# Patient Record
Sex: Male | Born: 1960
Health system: Southern US, Community
[De-identification: ages and names within clinical notes are randomized; demographics above are authoritative.]

## PROBLEM LIST (undated history)

## (undated) DIAGNOSIS — K219 Gastro-esophageal reflux disease without esophagitis: Secondary | ICD-10-CM

## (undated) DIAGNOSIS — E785 Hyperlipidemia, unspecified: Secondary | ICD-10-CM

## (undated) HISTORY — PX: CHOLECYSTECTOMY: SHX55

## (undated) HISTORY — PX: HERNIA REPAIR: SHX51

## (undated) HISTORY — PX: APPENDECTOMY: SHX54

## (undated) HISTORY — PX: OTHER SURGICAL HISTORY: SHX169

## (undated) HISTORY — DX: Hyperlipidemia, unspecified: E78.5

## (undated) HISTORY — DX: Gastro-esophageal reflux disease without esophagitis: K21.9

---

## 2004-12-16 ENCOUNTER — Ambulatory Visit (HOSPITAL_COMMUNITY): Admission: RE | Admit: 2004-12-16 | Discharge: 2004-12-16 | Payer: Self-pay | Admitting: *Deleted

## 2005-01-05 ENCOUNTER — Ambulatory Visit (HOSPITAL_COMMUNITY): Admission: RE | Admit: 2005-01-05 | Discharge: 2005-01-05 | Payer: Self-pay | Admitting: General Surgery

## 2005-01-09 ENCOUNTER — Encounter (HOSPITAL_COMMUNITY): Admission: RE | Admit: 2005-01-09 | Discharge: 2005-02-08 | Payer: Self-pay | Admitting: Internal Medicine

## 2005-01-16 ENCOUNTER — Ambulatory Visit (HOSPITAL_COMMUNITY): Admission: RE | Admit: 2005-01-16 | Discharge: 2005-01-16 | Payer: Self-pay | Admitting: General Surgery

## 2011-04-13 ENCOUNTER — Other Ambulatory Visit: Payer: Self-pay | Admitting: General Surgery

## 2011-04-13 ENCOUNTER — Encounter (HOSPITAL_COMMUNITY): Payer: Managed Care, Other (non HMO)

## 2011-04-13 LAB — CBC
Hemoglobin: 15 g/dL (ref 13.0–17.0)
RBC: 5.1 MIL/uL (ref 4.22–5.81)

## 2011-04-13 LAB — SURGICAL PCR SCREEN: MRSA, PCR: NEGATIVE

## 2011-04-13 LAB — BASIC METABOLIC PANEL
CO2: 26 mEq/L (ref 19–32)
Chloride: 103 mEq/L (ref 96–112)
GFR calc Af Amer: >60 mL/min (ref >60)
Sodium: 138 mEq/L (ref 135–145)

## 2011-04-13 NOTE — H&P (Signed)
  NAME:  Steve Stone, Steve Stone NO.:  [id number]  MEDICAL RECORD NO.:  [id number]  LOCATION:                                 FACILITY:  PHYSICIAN:  Dalia Heading, M.D.  DATE OF BIRTH:  June 06, 1961  DATE OF ADMISSION: DATE OF DISCHARGE:  LH                             HISTORY & PHYSICAL   CHIEF COMPLAINT:  Left inguinal hernia.  HISTORY OF PRESENT ILLNESS:  The patient is a 50 year old white male who presents with a left inguinal hernia.  It has been present over the past few months, it has increased in size, and is causing him discomfort.  No nausea or vomiting have been noted.  PAST MEDICAL HISTORY:  Reflux disease.  PAST SURGICAL HISTORY:  Appendectomy and cholecystectomy.  CURRENT MEDICATIONS:  Nexium daily.  ALLERGIES:  No known drug allergies.  SOCIAL HISTORY:  The patient smokes a pack of cigarettes a day.  Drinks alcohol socially.  Denies any illicit drug use.  REVIEW OF SYSTEMS:  He denies any other cardiopulmonary difficulties or bleeding disorders.  FAMILY MEDICAL HISTORY:  Noncontributory.  PHYSICAL EXAMINATION:  GENERAL:  The patient is a well-developed, well- nourished white male in no acute distress.  He is afebrile.  VITAL SIGNS:  Stable. HEENT:  Unremarkable. NECK:  Supple without lymphadenopathy or carotid bruits. LUNGS:  Clear to auscultation with equal breath sounds bilaterally. HEART:  Regular rate and rhythm without S3, S4, or murmurs. ABDOMEN:  Soft and nontender, nondistended.  A reducible left inguinal hernia is present.  No rigidity is noted.  GENITOURINARY:  Within normal limits.  IMPRESSION:  Left inguinal hernia.  PLAN:  The patient is scheduled for left inguinal herniorrhaphy on April 17, 2011.  The risks and benefits of the procedure including bleeding, infection, recurrence of the hernia were fully explained to the patient, gave informed consent.     Dalia Heading, M.D.     MAJ/MEDQ  D:  04/11/2011  T:   04/12/2011  Job:  [id number]  cc:   Dr. [facility]  Short Stay [facility]  Electronically Signed by Franky Macho M.D. on 04/13/2011 04:54:09 PM

## 2011-04-17 ENCOUNTER — Ambulatory Visit (HOSPITAL_COMMUNITY)
Admission: RE | Admit: 2011-04-17 | Discharge: 2011-04-17 | Disposition: A | Discharge status: Home / Self Care | Payer: Managed Care, Other (non HMO) | Source: Ambulatory Visit | Attending: General Surgery | Admitting: General Surgery

## 2011-04-17 DIAGNOSIS — Z01818 Encounter for other preprocedural examination: Secondary | ICD-10-CM | POA: Insufficient documentation

## 2011-04-17 DIAGNOSIS — Z01812 Encounter for preprocedural laboratory examination: Secondary | ICD-10-CM | POA: Insufficient documentation

## 2011-04-17 DIAGNOSIS — K4090 Unilateral inguinal hernia, without obstruction or gangrene, not specified as recurrent: Secondary | ICD-10-CM | POA: Insufficient documentation

## 2011-04-18 NOTE — Op Note (Signed)
  NAME:  Steve Stone, KNOBLOCK NO.:  [id number]  MEDICAL RECORD NO.:  [id number]  LOCATION:  [facility]                          FACILITY:  [facility]  PHYSICIAN:  Dalia Heading, M.D.  DATE OF BIRTH:  1961/08/19  DATE OF PROCEDURE:  04/17/2011 DATE OF DISCHARGE:                              OPERATIVE REPORT   PREOPERATIVE DIAGNOSIS:  Left inguinal hernia.  POSTOPERATIVE DIAGNOSIS:  Left inguinal hernia, indirect.  PROCEDURE:  Left inguinal herniorrhaphy.  SURGEON:  Dalia Heading, MD  ANESTHESIA:  General endotracheal.  INDICATIONS:  The patient is a 51 year old white male who presents with a symptomatic left inguinal hernia.  The risks and benefits of the procedure including bleeding, infection, pain, and recurrence of the hernia were fully explained to the patient, gave informed consent.  PROCEDURE NOTE:  The patient was placed in supine position.  After induction of general endotracheal anesthesia, the left groin region was prepped and draped using the usual sterile technique with Betadine. Surgical site confirmation was performed.  A transverse incision was made in the left groin region down to the external oblique aponeurosis.  The aponeurosis was incised to the external ring.  A Penrose drain was placed around the spermatic cord. The vas deferens was noted within the spermatic cord.  The ilioinguinal nerve was identified and retracted superiorly from the operative field. An indirect hernia sac was found along with a lipoma of the cord.  The lipoma was excised.  The indirect hernia sac was dissected up to the perineal reflection and inverted.  A medium-size Bard mesh PerFix plug was then inserted and secured to the transversalis fascia using 2-0 Novofil interrupted sutures.  An onlay patch was then placed along the floor of the inguinal canal and secured superiorly to the conjoined tendon and inferiorly to the shelving edge of Poupart ligament using 2-0 Novofil  interrupted sutures.  The internal ring was recreated using a 2- 0 Novofil interrupted suture.  The external oblique aponeurosis was reapproximated using a 2-0 Vicryl running suture.  Subcutaneous layer was reapproximated using 3-0 Vicryl interrupted sutures.  The skin was closed using a 4-0 Vicryl subcuticular suture.  A 0.5% Sensorcaine was instilled into the surrounding wound.  Dermabond was then applied.  All tape and needle counts were correct at the end of the procedure. The patient was extubated in the operating room and went back to recovery room awake in stable condition.  COMPLICATIONS:  None.  SPECIMEN:  None.  BLOOD LOSS:  Minimal.     Dalia Heading, M.D.     MAJ/MEDQ  D:  04/17/2011  T:  04/18/2011  Job:  [id number]  cc:   Dr[facility], [place], [facility]  Electronically Signed by Franky Macho M.D. on 04/18/2011 12:28:11 PM

## 2012-08-07 ENCOUNTER — Other Ambulatory Visit: Payer: Self-pay | Admitting: Cardiology

## 2012-08-08 ENCOUNTER — Encounter: Payer: Self-pay | Admitting: Cardiology

## 2012-08-08 DIAGNOSIS — R079 Chest pain, unspecified: Secondary | ICD-10-CM

## 2012-08-12 DIAGNOSIS — R079 Chest pain, unspecified: Secondary | ICD-10-CM

## 2012-09-11 ENCOUNTER — Encounter: Payer: Self-pay | Admitting: Cardiology

## 2012-09-26 ENCOUNTER — Encounter: Payer: Self-pay | Admitting: Cardiology

## 2012-09-26 ENCOUNTER — Ambulatory Visit (INDEPENDENT_AMBULATORY_CARE_PROVIDER_SITE_OTHER): Payer: BC Managed Care – PPO | Admitting: Cardiology

## 2012-09-26 VITALS — BP 114/76 | HR 80 | Ht 73.0 in | Wt 232.8 lb

## 2012-09-26 DIAGNOSIS — R0989 Other specified symptoms and signs involving the circulatory and respiratory systems: Secondary | ICD-10-CM

## 2012-09-26 DIAGNOSIS — R0609 Other forms of dyspnea: Secondary | ICD-10-CM

## 2012-09-26 DIAGNOSIS — R079 Chest pain, unspecified: Secondary | ICD-10-CM | POA: Insufficient documentation

## 2012-09-26 DIAGNOSIS — Z136 Encounter for screening for cardiovascular disorders: Secondary | ICD-10-CM

## 2012-09-26 DIAGNOSIS — R0600 Dyspnea, unspecified: Secondary | ICD-10-CM | POA: Insufficient documentation

## 2012-09-26 NOTE — Assessment & Plan Note (Signed)
Patient also with some dyspnea on exertion. Schedule d-dimer to screen for pulmonary embolus.

## 2012-09-26 NOTE — Assessment & Plan Note (Signed)
Patient symptoms are extremely atypical and most likely musculoskeletal. Myoview was technically difficult but did not show significant ischemia. Echocardiogram showed low normal LV function with question wall motion abnormalities. His electrocardiogram is normal. He has had continuous pain for 2 months and I therefore do not think this is cardiac. I have asked him to take a short course of nonsteroidals. He will take Motrin 600 mg by mouth 3 times a day with food for 3 days to see if his symptoms improve. Continue Prilosec. I do not think he needs cardiac catheterization at this point unless his symptoms change.

## 2012-09-26 NOTE — Progress Notes (Signed)
  HPI: 51 year old male for evaluation of chest pain. The patient was admitted to [facility] in October of 2013 with chest pain. He ruled out for myocardial infarction with serial enzymes. A chest x-ray was negative. He had a nuclear study which revealed poor exercise tolerance. He had no ECG changes and no chest pain. The images were suboptimal due to intense GI activity and diaphragmatic attenuation but there was felt to be no significant ischemia. Ejection fraction was 48%. Echocardiogram in October of 2013 showed low normal LV function with an ejection fraction of 50-55%. There was a question of wall motion abnormalities. There was grade 2 diastolic dysfunction. The patient has had chest pain continuously for approximately 2 months. It is in the substernal and under left breast area. Increases with inspiration and moving his left upper extremity. No radiation or associated symptoms. The pain is not exertional or related to food. He also notes dyspnea on exertion but no orthopnea, PND, pedal edema, palpitations, syncope or exertional chest pain.  Current Outpatient Prescriptions  Medication Sig Dispense Refill  . omeprazole (PRILOSEC) 40 MG capsule Take 40 mg by mouth daily.        No Known Allergies  Past Medical History  Diagnosis Date  . Esophageal reflux   . Hyperlipidemia     Past Surgical History  Procedure Date  . Appendectomy   . Hernia repair   . Cholecystectomy   . Arthroscopic knee surgery     Right    History   Social History  . Marital Status: Married    Spouse Name: N/A    Number of Children: 4  . Years of Education: N/A   Occupational History  .  [employer]   Social History Main Topics  . Smoking status: Never Smoker   . Smokeless tobacco: Not on file  . Alcohol Use: Yes     Comment: One beer per night  . Drug Use: No  . Sexually Active: Not on file   Other Topics Concern  . Not on file   Social History Narrative  . No narrative on file      Family History  Problem Relation Age of Onset  . Heart disease      No family history    ROS: no fevers or chills, productive cough, hemoptysis, dysphasia, odynophagia, melena, hematochezia, dysuria, hematuria, rash, seizure activity, orthopnea, PND, pedal edema, claudication. Remaining systems are negative.  Physical Exam:   Blood pressure 114/76, pulse 80, height 6\' 1"  (1.854 m), weight 232 lb 12.8 oz (105.597 kg).  General:  Well developed/well nourished in NAD Skin warm/dry Patient not depressed No peripheral clubbing Back-normal HEENT-normal/normal eyelids Neck supple/normal carotid upstroke bilaterally; no bruits; no JVD; no thyromegaly chest - CTA/ normal expansion CV - RRR/normal S1 and S2; no murmurs, rubs or gallops;  PMI nondisplaced Abdomen -NT/ND, no HSM, no mass, + bowel sounds, no bruit 2+ femoral pulses, no bruits Ext-no edema, chords, 2+ DP Neuro-grossly nonfocal  ECG sinus rhythm at a rate of 80. RV conduction delay. No ST changes.

## 2012-09-26 NOTE — Patient Instructions (Addendum)
   Lab today for D-Dimer  Office will contact with results Continue all current medications. Follow up in  3 months

## 2012-09-27 ENCOUNTER — Telehealth: Payer: Self-pay | Admitting: *Deleted

## 2012-09-27 NOTE — Telephone Encounter (Signed)
Notes Recorded by Lesle Chris, LPN on 45/40/9811 at 3:18 PM Patient notified. Notes Recorded by Lesle Chris, LPN on 91/47/8295 at 3:06 PM Left message to return call.

## 2012-09-27 NOTE — Telephone Encounter (Signed)
Message copied by Lesle Chris on Fri Sep 27, 2012  3:18 PM ------      Message from: MATHIS, DEBRA W      Created: Fri Sep 27, 2012  2:28 PM                   ----- Message -----         From: Lewayne Bunting, MD         Sent: 09/27/2012   2:18 PM           To: Deliah Goody, RN            [place]

## 2012-12-23 ENCOUNTER — Telehealth: Payer: Self-pay | Admitting: Cardiology

## 2012-12-23 NOTE — Telephone Encounter (Signed)
Patient wife called and stated that he wanted to cancel 2-21 appointment. I asked if he wanted to reschedule and pt wife stated no that they discovered the problem was not his heart it was with acid reflux and they feel like he doesn't need to been seen back in our office.

## 2012-12-27 ENCOUNTER — Ambulatory Visit: Payer: BC Managed Care – PPO | Admitting: Cardiology

## 2014-07-14 ENCOUNTER — Encounter (HOSPITAL_COMMUNITY): Payer: Self-pay | Admitting: Emergency Medicine

## 2014-07-14 ENCOUNTER — Observation Stay (HOSPITAL_COMMUNITY)
Admission: EM | Admit: 2014-07-14 | Discharge: 2014-07-15 | Disposition: A | Discharge status: Home / Self Care | Payer: BC Managed Care – PPO | Attending: Emergency Medicine | Admitting: Emergency Medicine

## 2014-07-14 ENCOUNTER — Emergency Department (HOSPITAL_COMMUNITY): Payer: BC Managed Care – PPO

## 2014-07-14 DIAGNOSIS — R072 Precordial pain: Secondary | ICD-10-CM | POA: Diagnosis present

## 2014-07-14 DIAGNOSIS — Z79899 Other long term (current) drug therapy: Secondary | ICD-10-CM | POA: Insufficient documentation

## 2014-07-14 DIAGNOSIS — E782 Mixed hyperlipidemia: Secondary | ICD-10-CM

## 2014-07-14 DIAGNOSIS — K219 Gastro-esophageal reflux disease without esophagitis: Secondary | ICD-10-CM | POA: Insufficient documentation

## 2014-07-14 DIAGNOSIS — R457 State of emotional shock and stress, unspecified: Secondary | ICD-10-CM

## 2014-07-14 DIAGNOSIS — E785 Hyperlipidemia, unspecified: Secondary | ICD-10-CM | POA: Diagnosis not present

## 2014-07-14 DIAGNOSIS — R079 Chest pain, unspecified: Secondary | ICD-10-CM | POA: Diagnosis present

## 2014-07-14 LAB — BASIC METABOLIC PANEL
Anion gap: 10 (ref 5–15)
BUN: 19 mg/dL (ref 6–23)
CALCIUM: 9.1 mg/dL (ref 8.4–10.5)
CO2: 25 mEq/L (ref 19–32)
Chloride: 106 mEq/L (ref 96–112)
Creatinine, Ser: 1.19 mg/dL (ref 0.50–1.35)
GFR, EST AFRICAN AMERICAN: 78 mL/min — AB (ref >90)
GFR, EST NON AFRICAN AMERICAN: 68 mL/min — AB (ref >90)
GLUCOSE: 101 mg/dL — AB (ref 70–99)
POTASSIUM: 4.3 meq/L (ref 3.7–5.3)
SODIUM: 141 meq/L (ref 137–147)

## 2014-07-14 LAB — CBC WITH DIFFERENTIAL/PLATELET
BASOS PCT: 0 % (ref 0–1)
Basophils Absolute: 0 10*3/uL (ref 0.0–0.1)
EOS ABS: 0.1 10*3/uL (ref 0.0–0.7)
EOS PCT: 1 % (ref 0–5)
HCT: 43.6 % (ref 39.0–52.0)
Hemoglobin: 15.4 g/dL (ref 13.0–17.0)
LYMPHS ABS: 1.6 10*3/uL (ref 0.7–4.0)
Lymphocytes Relative: 29 % (ref 12–46)
MCH: 30.6 pg (ref 26.0–34.0)
MCHC: 35.3 g/dL (ref 30.0–36.0)
MCV: 86.7 fL (ref 78.0–100.0)
Monocytes Absolute: 0.4 10*3/uL (ref 0.1–1.0)
Monocytes Relative: 7 % (ref 3–12)
NEUTROS PCT: 63 % (ref 43–77)
Neutro Abs: 3.5 10*3/uL (ref 1.7–7.7)
PLATELETS: 176 10*3/uL (ref 150–400)
RBC: 5.03 MIL/uL (ref 4.22–5.81)
RDW: 12.9 % (ref 11.5–15.5)
WBC: 5.6 10*3/uL (ref 4.0–10.5)

## 2014-07-14 LAB — TROPONIN I: Troponin I: <0.3 ng/mL (ref <0.30)

## 2014-07-14 MED ORDER — ONDANSETRON HCL 4 MG/2ML IJ SOLN: 4.0000 mg | Freq: Four times a day (QID) | INTRAMUSCULAR | Status: DC | PRN

## 2014-07-14 MED ORDER — GI COCKTAIL ~~LOC~~
30.0000 mL | Freq: Once | ORAL | Status: AC
  Administered 2014-07-14: 30 mL via ORAL
  Filled 2014-07-14: qty 30

## 2014-07-14 MED ORDER — ASPIRIN EC 81 MG PO TBEC
81.0000 mg | DELAYED_RELEASE_TABLET | Freq: Every day | ORAL | Status: DC
  Administered 2014-07-15: 81 mg via ORAL
  Filled 2014-07-14 (×3): qty 1

## 2014-07-14 MED ORDER — ACETAMINOPHEN 325 MG PO TABS: 650.0000 mg | ORAL_TABLET | ORAL | Status: DC | PRN

## 2014-07-14 MED ORDER — ALPRAZOLAM 0.5 MG PO TABS
0.5000 mg | ORAL_TABLET | Freq: Two times a day (BID) | ORAL | Status: DC | PRN
  Filled 2014-07-14: qty 2

## 2014-07-14 MED ORDER — ASPIRIN 325 MG PO TABS
325.0000 mg | ORAL_TABLET | Freq: Once | ORAL | Status: AC
  Administered 2014-07-14: 325 mg via ORAL
  Filled 2014-07-14: qty 1

## 2014-07-14 MED ORDER — PANTOPRAZOLE SODIUM 40 MG PO TBEC
40.0000 mg | DELAYED_RELEASE_TABLET | Freq: Two times a day (BID) | ORAL | Status: DC
  Administered 2014-07-14 – 2014-07-15 (×2): 40 mg via ORAL
  Filled 2014-07-14 (×2): qty 1

## 2014-07-14 MED ORDER — ENOXAPARIN SODIUM 40 MG/0.4ML ~~LOC~~ SOLN
40.0000 mg | SUBCUTANEOUS | Status: DC
  Administered 2014-07-14 – 2014-07-15 (×2): 40 mg via SUBCUTANEOUS
  Filled 2014-07-14 (×2): qty 0.4

## 2014-07-14 NOTE — ED Notes (Signed)
Pt c/o intermittent cp since last Wednesday with weakness and episodes of sweating.

## 2014-07-14 NOTE — H&P (Signed)
[facility] History and Physical  Steve Stone UXN:[id number] DOB: [birth year] DOA: 07/14/2014  Referring physician: ED PCP: Curlene Labrum, MD  Specialists: None  Chief Complaint: chest pain  HPI: 53 y/o ? Morbid obesity, Body mass index is 30.51 kg/(m^2)., HLd, REflux-prior eval 09/2012 for this c ECHO didn't show WM anomalies came to ED 07/14/14 with one-week onset chest pain. He states that chest pain actually first started 07/08/14 felt like pressure in the chest with radiation down the arm. This was followed by him feeling progressively weaker with numbness in his arms as well as pain in the left lower extremity. 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 His job is very active in his constantly lifting and moving heavy furniture. In either event this morning he had pain on his way to work in his chest with radiation down his arm he states that this is intermittent. He states he had some diaphoresis as well as nausea and has a history of GERD but was told late last week after that his primary care physician for this concerns to double up on his TPN was set up for a stress test as well as an echocardiogram to be done soon He has other components to his pain- He states that when he moves and lays on his side when he sitting or when he is lying on his back he has some component of pain but this is a different more musculoskeletal pain.  He states that when he lays on his arm at night he has numbness going down the left arm. He was given a GI cocktail in the emergency room with no specific benefit and did not feel any relief from his pain although in the past is intact but had GERD   Review of Systems: The patient denies  Fever Chills Nausea Vomiting Blurred vision Double vision Unilateral weakness He does have some numbness in his left leg and felt like his feet were wobbly this morning  when he had chest pain. Dysuria Hematuria Melena or hematemesis   Past Medical History  Diagnosis Date  . Esophageal reflux   . Hyperlipidemia    Past Surgical History  Procedure Laterality Date  . Appendectomy    . Hernia repair    . Cholecystectomy    . Arthroscopic knee surgery      Right   Social History:  History   Social History Narrative  . No narrative on file    No Known Allergies  Family History  Problem Relation Age of Onset  . Heart disease      Grandparents when they were in the 7s  . Colon cancer      Father    Prior to Admission medications   Medication Sig Start Date End Date Taking? Authorizing Provider  ALPRAZolam Duanne Moron) 1 MG tablet Take 0.5-1 mg by mouth 2 (two) times daily as needed for anxiety.   Yes Historical Provider, MD  pantoprazole (PROTONIX) 40 MG tablet Take 40 mg by mouth 2 (two) times daily. 06/26/14  Yes Historical Provider, MD   Physical Exam: Filed Vitals:   07/14/14 1000 07/14/14 1030 07/14/14 1032 07/14/14 1100  BP: 110/89 106/87 106/87 112/86  Pulse: 63 63 65 65  Temp:      TempSrc:      Resp: 17  15 18   Height:      Weight:  SpO2: 93%  95% 95%     General:  Alert pleasant oriented no apparent distress  Eyes: EOMI NCAT  ENT: Moderate dentition Mallampati 2  Neck: No JVD no bruit  Cardiovascular: S1-S2 no murmur rub or gallop  Respiratory: Clinically clear no added sound  Abdomen: Soft nontender nondistended no epigastric tenderness no rebound or guarding  Skin: No lower extremity edema  Musculoskeletal: Point tenderness to the left shoulder at bicipital insertion around the a.c. joint in addition pectoralis minor tenderness and difficulty placing left hand on top of head has tenderness over there he states however categorically that this pain is different from the chest pain that he's been having intermittent  Psychiatric: Euthymic but a little anxious  Neurologic: Grossly intact power 5/5, reflexes  2/3 finger-nose-finger test normal sensory intact  Labs on Admission:  Basic Metabolic Panel:  Recent Labs Lab 07/14/14 0922  NA 141  K 4.3  CL 106  CO2 25  GLUCOSE 101*  BUN 19  CREATININE 1.19  CALCIUM 9.1   Liver Function Tests: No results found for this basename: AST, ALT, ALKPHOS, BILITOT, PROT, ALBUMIN,  in the last 168 hours No results found for this basename: LIPASE, AMYLASE,  in the last 168 hours No results found for this basename: AMMONIA,  in the last 168 hours CBC:  Recent Labs Lab 07/14/14 0922  WBC 5.6  NEUTROABS 3.5  HGB 15.4  HCT 43.6  MCV 86.7  PLT 176   Cardiac Enzymes:  Recent Labs Lab 07/14/14 0922  TROPONINI <0.30    BNP (last 3 results) No results found for this basename: PROBNP,  in the last 8760 hours CBG: No results found for this basename: GLUCAP,  in the last 168 hours  Radiological Exams on Admission: Dg Chest Portable 1 View  07/14/2014   CLINICAL DATA:  Left chest pain  EXAM: PORTABLE CHEST - 1 VIEW  COMPARISON:  None.  FINDINGS: The lungs are clear and negative for focal airspace consolidation, pulmonary edema or suspicious pulmonary nodule. No pleural effusion or pneumothorax. Cardiac and mediastinal contours are within normal limits. No acute fracture or lytic or blastic osseous lesions. The visualized upper abdominal bowel gas pattern is unremarkable.  IMPRESSION: No active cardiopulmonary disease.   Electronically Signed   By: Jacqulynn Cadet M.D.   On: 07/14/2014 09:37    EKG: Independently reviewed. EKG normal sinus rhythm incomplete right bundle, no acute ST-T wave changes  Assessment/Plan #1 chest pain-ROMI-possible stable angina given his symptomatology is atypical. Heart score is about 3-4 and therefore he does warrant at least cardiac enzymes to rule him out. Differential diagnosis either pericarditis vs. musculoskeletal pain with significant psychiatric overlay as well as reflux-because of diagnostic uncertainty and no  relief with GI cocktail I feel it is necessary to admit him and we will consult cardiology-in any case he was supposed to have echocardiogram and stress test and I will defer to cardiology which on [place] and therefore I will not order at this time. We will cycle cardiac enzymes and repeat EKG in the morning. His blood pressure is too low for beta blocker or ACE inhibitor at this stage-he will be on a prophylactic dosage as Lovenox. We will continue 81 mg of aspirin dailyhe has no current chest pain #2 musculoskeletal chest pain-I will place patient on very small dose of Flexeril 5 mg every 8 when necessary to see if this helps with some of the spasm. In addition he may benefit from physical/occupational therapy to  teach him shoulder mobility range of motion exercises and may benefit from some rehabilitative exercises to be continued as an outpatient #3 reflux -continue twice a day PPI #4 morbid obesity-outpatient followup #5 psychosocial psychogenic stressors-monitor   Time spent: [place], [facility] Pager [phone]  If 7PM-7AM, please contact night-coverage [email or web address] Password [email or web address] 07/14/2014, 1:09 PM

## 2014-07-14 NOTE — ED Notes (Signed)
EDP in to update.

## 2014-07-14 NOTE — ED Notes (Signed)
Placed pt on cardiac monitor

## 2014-07-14 NOTE — ED Provider Notes (Signed)
CSN: [id number]     Arrival date & time 07/14/14  0901 History  This chart was scribed for Nat Christen, MD by Ludger Nutting, ED Scribe. This patient was seen in room APA18/APA18 and the patient's care was started 9:37 AM.     Chief Complaint  Patient presents with  . Chest Pain    The history is provided by the patient. No language interpreter was used.    HPI Comments: Steve Stone is a 53 y.o. male with past medical history of HLD who presents to the Emergency Department complaining of intermittent episodes of unchanged central chest pain that began 6 days ago. Patient states the initial episode lasted for 5 minutes and involved radiation to the left arm with associated diaphoresis. He states the episodes of chest pain is aggravated with physical activity. He reports similar symptoms 2 years ago but was diagnosed with indigestion. He has a history of GERD for which he takes omeprazole. He denies SOB, nausea, vomiting. 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    PCP [name]  Past Medical History  Diagnosis Date  . Esophageal reflux   . Hyperlipidemia    Past Surgical History  Procedure Laterality Date  . Appendectomy    . Hernia repair    . Cholecystectomy    . Arthroscopic knee surgery      Right   Family History  Problem Relation Age of Onset  . Heart disease      No family history   History  Substance Use Topics  . Smoking status: Never Smoker   . Smokeless tobacco: Not on file  . Alcohol Use: Yes     Comment: One beer per night    Review of Systems  A complete 10 system review of systems was obtained and all systems are negative except as noted in the HPI and PMH.    Allergies  Review of patient's allergies indicates no known allergies.  Home Medications   Prior to Admission medications   Medication Sig Start Date End Date Taking? Authorizing  Provider  ALPRAZolam Duanne Moron) 1 MG tablet Take 0.5-1 mg by mouth 2 (two) times daily as needed for anxiety.   Yes Historical Provider, MD  pantoprazole (PROTONIX) 40 MG tablet Take 40 mg by mouth 2 (two) times daily. 06/26/14  Yes Historical Provider, MD   BP 112/86  Pulse 65  Temp(Src) 97.6 F (36.4 C) (Oral)  Resp 18  Ht 6' (1.829 m)  Wt 225 lb (102.059 kg)  BMI 30.51 kg/m2  SpO2 95% Physical Exam  Nursing note and vitals reviewed. Constitutional: He is oriented to person, place, and time. He appears well-developed and well-nourished.  HENT:  Head: Normocephalic and atraumatic.  Eyes: Conjunctivae and EOM are normal. Pupils are equal, round, and reactive to light.  Neck: Normal range of motion. Neck supple.  Cardiovascular: Normal rate, regular rhythm and normal heart sounds.   Pulmonary/Chest: Effort normal and breath sounds normal.  Abdominal: Soft. Bowel sounds are normal.  Musculoskeletal: Normal range of motion.  Neurological: He is alert and oriented to person, place, and time.  Skin: Skin is warm and dry.  Psychiatric: He has a normal mood and affect. His behavior is normal.    ED Course  Procedures (including critical care time)  DIAGNOSTIC STUDIES: Oxygen Saturation is 94% on RA, adequate by my interpretation.  COORDINATION OF CARE: 9:46 AM Will order CXR, EKG, and lab work. Discussed treatment plan with pt at bedside and pt agreed to plan.   Labs Review Labs Reviewed  BASIC METABOLIC PANEL - Abnormal; Notable for the following:    Glucose, Bld 101 (*)    GFR calc non Af Amer 68 (*)    GFR calc Af Amer 78 (*)    All other components within normal limits  CBC WITH DIFFERENTIAL  TROPONIN I    Imaging Review Dg Chest Portable 1 View  07/14/2014   CLINICAL DATA:  Left chest pain  EXAM: PORTABLE CHEST - 1 VIEW  COMPARISON:  None.  FINDINGS: The lungs are clear and negative for focal airspace consolidation, pulmonary edema or suspicious pulmonary nodule. No  pleural effusion or pneumothorax. Cardiac and mediastinal contours are within normal limits. No acute fracture or lytic or blastic osseous lesions. The visualized upper abdominal bowel gas pattern is unremarkable.  IMPRESSION: No active cardiopulmonary disease.   Electronically Signed   By: Jacqulynn Cadet M.D.   On: 07/14/2014 09:37     EKG Interpretation   Date/Time:  Tuesday July 14 2014 09:08:56 EDT Ventricular Rate:  68 PR Interval:  168 QRS Duration: 104 QT Interval:  382 QTC Calculation: 406 R Axis:   78 Text Interpretation:  Normal sinus rhythm Incomplete right bundle branch  block Borderline ECG Confirmed by Chirstine Defrain  MD, Haniya Fern ([id number]) on 07/14/2014  9:22:51 AM      MDM   Final diagnoses:  Chest pain, unspecified chest pain type    History worrisome for cardiac related pain.  Patient has a history of GERD and has doubled his medication recently.  EKG and troponin are negative for acute findings. This has not helped his symptoms.  Discussed with hospitalist Dr. Verlon Au.  Will evaluate patient.  I personally performed the services described in this documentation, which was scribed in my presence. The recorded information has been reviewed and is accurate.   Nat Christen, MD 07/14/14 [phone]

## 2014-07-15 ENCOUNTER — Encounter (HOSPITAL_COMMUNITY): Payer: Self-pay

## 2014-07-15 ENCOUNTER — Encounter (HOSPITAL_COMMUNITY): Payer: BC Managed Care – PPO

## 2014-07-15 ENCOUNTER — Encounter (HOSPITAL_COMMUNITY): Payer: Self-pay | Admitting: Cardiology

## 2014-07-15 ENCOUNTER — Encounter (HOSPITAL_COMMUNITY): Payer: BC Managed Care – PPO | Attending: Cardiology

## 2014-07-15 DIAGNOSIS — F430 Acute stress reaction: Secondary | ICD-10-CM

## 2014-07-15 DIAGNOSIS — Z79899 Other long term (current) drug therapy: Secondary | ICD-10-CM | POA: Diagnosis not present

## 2014-07-15 DIAGNOSIS — R079 Chest pain, unspecified: Secondary | ICD-10-CM | POA: Insufficient documentation

## 2014-07-15 DIAGNOSIS — K219 Gastro-esophageal reflux disease without esophagitis: Secondary | ICD-10-CM | POA: Diagnosis not present

## 2014-07-15 DIAGNOSIS — R072 Precordial pain: Secondary | ICD-10-CM

## 2014-07-15 DIAGNOSIS — R457 State of emotional shock and stress, unspecified: Secondary | ICD-10-CM

## 2014-07-15 DIAGNOSIS — E785 Hyperlipidemia, unspecified: Secondary | ICD-10-CM | POA: Insufficient documentation

## 2014-07-15 DIAGNOSIS — I517 Cardiomegaly: Secondary | ICD-10-CM

## 2014-07-15 DIAGNOSIS — E782 Mixed hyperlipidemia: Secondary | ICD-10-CM

## 2014-07-15 MED ORDER — TECHNETIUM TC 99M SESTAMIBI - CARDIOLITE
10.0000 | Freq: Once | INTRAVENOUS | Status: AC | PRN
  Administered 2014-07-15: 10 via INTRAVENOUS

## 2014-07-15 MED ORDER — SODIUM CHLORIDE 0.9 % IV SOLN: 250.0000 mL | INTRAVENOUS | Status: DC | PRN

## 2014-07-15 MED ORDER — SODIUM CHLORIDE 0.9 % IJ SOLN
3.0000 mL | Freq: Two times a day (BID) | INTRAMUSCULAR | Status: DC
  Administered 2014-07-15: 3 mL via INTRAVENOUS

## 2014-07-15 MED ORDER — SODIUM CHLORIDE 0.9 % IJ SOLN: 3.0000 mL | INTRAMUSCULAR | Status: DC | PRN

## 2014-07-15 MED ORDER — CELECOXIB 200 MG PO CAPS
200.0000 mg | ORAL_CAPSULE | Freq: Two times a day (BID) | ORAL | Status: DC | PRN
Start: 2014-07-15 — End: 2017-12-19

## 2014-07-15 MED ORDER — TECHNETIUM TC 99M SESTAMIBI GENERIC - CARDIOLITE
30.0000 | Freq: Once | INTRAVENOUS | Status: AC | PRN
  Administered 2014-07-15: 30 via INTRAVENOUS

## 2014-07-15 NOTE — Consult Note (Signed)
Primary cardiologist: Previously Dr. Kirk Ruths 2013 Consulting cardiologist: Dr. Satira Sark  Clinical Summary Steve Stone is a 53 y.o.male with past medical history outlined below, seen by Dr. Stanford Breed back in 2013 with atypical chest pain symptoms. He did have workup at that time including an echocardiogram reporting an LVEF of 50-55% with possible basal anterolateral and inferior wall motion abnormalities, as well as Cardiolite study that was mildly abnormal (mid anterior ischemia) in the setting of substantial artifact. No further ischemic cardiac workup was undertaken at that time, he was treated for reflux with improvement in symptoms.  He presents now complaining of chest pain. 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 Since that time he has had recurring exertional chest tightness with typical activities. Also has a more atypical sharp discomfort when he takes a breath in or lies on his left side.  Under observation cardiac markers have been normal, arguing against high risk ACS. ECG is nonspecific showing sinus rhythm with R' in lead V1.  He saw his primary care provider Dr. Pleas Koch recently about these symptoms, proton pump inhibitor was increased, he is also on Xanax with stress at home and work. His wife has breast cancer. There was some discussion about arranging a followup echocardiogram and stress test.   No Known Allergies  Medications Scheduled Medications: . aspirin EC  81 mg Oral Daily  . enoxaparin (LOVENOX) injection  40 mg Subcutaneous Q24H  . pantoprazole  40 mg Oral BID  . sodium chloride  3 mL Intravenous Q12H     PRN Medications: sodium chloride, acetaminophen, ALPRAZolam, ondansetron (ZOFRAN) IV, sodium chloride   Past Medical History  Diagnosis Date  . Esophageal reflux   . Hyperlipidemia     Past Surgical  History  Procedure Laterality Date  . Appendectomy    . Hernia repair    . Cholecystectomy    . Arthroscopic knee surgery Right     Family History  Problem Relation Age of Onset  . Heart disease      Grandparents when they were in the 25s  . Colon cancer Father     Father    Social History Steve Stone reports that he has never smoked. He does not have any smokeless tobacco history on file. Steve Stone reports that he drinks alcohol.  Review of Systems No palpitations or syncope. No claudication. Stable appetite. Other systems reviewed and negative except as outlined.  Physical Examination Blood pressure 99/62, pulse 72, temperature 98 F (36.7 C), temperature source Oral, resp. rate 20, height 6' (1.829 m), weight 225 lb (102.059 kg), SpO2 96.00%.  Intake/Output Summary (Last 24 hours) at 07/15/14 1051 Last data filed at 07/15/14 1017  Gross per 24 hour  Intake    723 ml  Output      0 ml  Net    723 ml   Telemetry: Sinus rhythm.  Patient appears comfortable at rest. HEENT: Conjunctiva and lids normal, oropharynx clear. Neck: Supple, no elevated JVP or carotid bruits, no thyromegaly. Lungs: Clear to auscultation, nonlabored breathing at rest. Cardiac: Regular rate and rhythm, no S3 or significant systolic murmur, no pericardial rub. Abdomen: Soft, nontender, bowel sounds present, no guarding or rebound. Extremities: No pitting edema, distal pulses 2+. Skin: Warm and dry. Musculoskeletal: No kyphosis. Neuropsychiatric: Alert and oriented x3, affect grossly appropriate.   Lab Results  Basic Metabolic Panel:  Recent Labs Lab 07/14/14 0922  NA 141  K 4.3  CL 106  CO2 25  GLUCOSE 101*  BUN 19  CREATININE 1.19  CALCIUM 9.1    CBC:  Recent Labs Lab 07/14/14 0922  WBC 5.6  NEUTROABS 3.5  HGB 15.4  HCT 43.6  MCV 86.7  PLT 176    Cardiac Enzymes:  Recent Labs Lab 07/14/14 0922 07/14/14 1542 07/14/14 1913 07/14/14 2100  TROPONINI <0.30 <0.30  <0.30 <0.30    Imaging PORTABLE CHEST - 1 VIEW  COMPARISON: None.  FINDINGS:  The lungs are clear and negative for focal airspace consolidation,  pulmonary edema or suspicious pulmonary nodule. No pleural effusion  or pneumothorax. Cardiac and mediastinal contours are within normal  limits. No acute fracture or lytic or blastic osseous lesions. The  visualized upper abdominal bowel gas pattern is unremarkable.  IMPRESSION:  No active cardiopulmonary disease.   Impression  1. Chest pain with both typical and atypical features. Consider possible unstable angina. Cardiac markers argue against ACS, and ECG is nonspecific without acute ST segment changes. He had prior cardiac workup in 2013 with somewhat equivocal results.  2. Known history of GERD, proton pump her recently increased by Dr. Pleas Koch.  3. Emotional stress.  4. Reported history of hyperlipidemia.   Recommendations  Reviewed records and discussed with patient today. We will proceed with an echocardiogram as well as exercise Cardiolite for followup cardiac structural and ischemic evaluation in light of his recent symptoms. Further plans to follow.   Satira Sark, M.D., F.A.C.C.

## 2014-07-15 NOTE — Progress Notes (Signed)
Stress Lab Nurses Notes - [place] 07/15/2014 Reason for doing test: Chest Pain Type of test: Stress Cardiolite / Rm 306 Nurse performing test: Gerrit Halls, RN Nuclear Medicine Tech: Redmond Baseman Echo Tech: Not Applicable MD performing test: S. McDowell/M.Bonnell Public PA Family MD: Burdine Test explained and consent signed: Yes.   IV started: Saline lock flushed, No redness or edema and Saline lock from floor Symptoms: Fatigue & SOB Treatment/Intervention: None Reason test stopped: fatigue After recovery IV was: No redness or edema and Saline Lock flushed Patient to return to [place]. Med at : 2:30 Patient discharged: Transported back to room 306 via wc Patient's Condition upon discharge was: stable Comments: During test peak BP 145/92 & HR 155.  Recovery BP 105/82 & HR 91.  Symptoms resolved in recovery. Geanie Cooley T

## 2014-07-15 NOTE — Discharge Summary (Signed)
Physician Discharge Summary  Steve Stone GDJ:[id number] DOB: [birth year] DOA: 07/14/2014  PCP: Curlene Labrum, MD  Admit date: 07/14/2014 Discharge date: 07/15/2014  Time spent: 42minutes  Recommendations for Outpatient Follow-up:  1. Follow up with primary care physician in one to 2 weeks  Discharge Diagnoses:  Active Problems:   Precordial pain, likely musculoskeletal in origin   Mixed hyperlipidemia   Emotional stress   Discharge Condition: Improved  Diet recommendation: Low salt  Filed Weights   07/14/14 0914 07/14/14 0917 07/14/14 1554  Weight: 102.059 kg (225 lb) 102.059 kg (225 lb) 102.059 kg (225 lb)    History of present illness and hospital course:  This is a 53 year old gentleman who was admitted to the hospital with chest pain. He reports his pain is worse when he lay down in certain sides of his chest. He reports significant stressors at his work. He has a fairly active job which often involves heavy lifting. He was at work when he developed chest pain radiating down his arm. He was sent to the ER for evaluation where he was admitted for further workup of chest pain. Cardiac enzymes are negative her ACS. He was seen by cardiology and underwent echocardiogram as well as stress testing. He was found to have any significant signs of ischemia. Recommendations were to follow up with his primary care physician. If he has recurrent/worsening of his pain, he can certainly be seen by cardiology again. On further examination, he reports reproducible chest pain on palpation. It appears that this is more musculoskeletal. I advised him to take Tylenol and given a prescription for Celebrex. He is currently on proton pump inhibitor therapy for GERD. He'll follow up with his primary care physician for further management  Procedures: Echocardiogram:- Mild LVH with LVEF 62%, grade 1 diastolic dysfunction. Normal global longitudinal strain of -19.9%. Mildly sclerotic aortic valve. Unable to  assess PASP. No pericardial effusion.    Consultations:  Cardiology  Discharge Exam: Filed Vitals:   07/15/14 1513  BP: 113/84  Pulse: 74  Temp: 98.2 F (36.8 C)  Resp: 20    General: No acute distress Cardiovascular: S1, S2, regular rate and rhythm Respiratory: Clear to auscultation bilaterally, chest pain reproducible on palpation of left chest  Discharge Instructions You were cared for by a hospitalist during your hospital stay. If you have any questions about your discharge medications or the care you received while you were in the hospital after you are discharged, you can call the unit and asked to speak with the hospitalist on call if the hospitalist that took care of you is not available. Once you are discharged, your primary care physician will handle any further medical issues. Please note that NO REFILLS for any discharge medications will be authorized once you are discharged, as it is imperative that you return to your primary care physician (or establish a relationship with a primary care physician if you do not have one) for your aftercare needs so that they can reassess your need for medications and monitor your lab values.  Discharge Instructions   Call MD for:  difficulty breathing, headache or visual disturbances    Complete by:  As directed      Call MD for:  severe uncontrolled pain    Complete by:  As directed      Diet - low sodium heart healthy    Complete by:  As directed      Increase activity slowly    Complete by:  As directed  Discharge Medication List as of 07/15/2014  5:18 PM    START taking these medications   Details  celecoxib (CELEBREX) 200 MG capsule Take 1 capsule (200 mg total) by mouth 2 (two) times daily as needed for mild pain., Starting 07/15/2014, Until Discontinued, Print      CONTINUE these medications which have NOT CHANGED   Details  ALPRAZolam (XANAX) 1 MG tablet Take 0.5-1 mg by mouth 2 (two) times daily as needed for  anxiety., Until Discontinued, Historical Med    pantoprazole (PROTONIX) 40 MG tablet Take 40 mg by mouth 2 (two) times daily., Starting 06/26/2014, Until Discontinued, Historical Med       No Known Allergies    The results of significant diagnostics from this hospitalization (including imaging, microbiology, ancillary and laboratory) are listed below for reference.    Significant Diagnostic Studies: Nm Myocar Multi W/spect W/wall Motion / Ef  07/15/2014   CLINICAL DATA:  53 year old male with history of GERD and hyperlipidemia, now admitted with recurrent chest pain symptoms. Cardiac markers are normal, and he is now referred for the assessment of ischemia.  EXAM: MYOCARDIAL IMAGING WITH SPECT (REST AND EXERCISE)  GATED LEFT VENTRICULAR WALL MOTION STUDY  LEFT VENTRICULAR EJECTION FRACTION  TECHNIQUE: Standard myocardial SPECT imaging was performed after resting intravenous injection of 10 mCi Tc-6m sestamibi. Subsequently, exercise tolerance test was performed by the patient under the supervision of the Cardiology staff. At peak-stress, 30 mCi Tc-2m sestamibi was injected intravenously and standard myocardial SPECT imaging was performed. Quantitative gated imaging was also performed to evaluate left ventricular wall motion, and estimate left ventricular ejection fraction.  FINDINGS: Baseline ECG shows normal sinus rhythm. Patient was exercised on a Bruce protocol for 6 min achieving a maximum workload of 7 METS. Heart rate increased from 66 beats per min up to 157 beats per min with was 94% of the maximal age predicted heart rate response. Blood pressure increased from 106/81 up to 145/92. No chest pain was reported. There were no diagnostic ST segment abnormalities to indicate ischemia, and no arrhythmias were noted. Low risk Duke treadmill score of 6.  Analysis of the overall perfusion data finds adequate myocardial radiotracer uptake. There is mild diaphragmatic attenuation. Tomographic views were  obtained using the short axis, vertical long axis, and horizontal long axis planes.  Perfusion: Small, mild intensity, fixed inferior wall defect most consistent with diaphragmatic attenuation.  Wall Motion: No focal wall motion abnormalities.  Left Ventricular Ejection Fraction: 50 %  End diastolic volume 856 ml  End systolic volume 50 ml  Summed stress score 0, summed rest score 0, summed difference score 0.  IMPRESSION: 1. Diaphragmatic attenuation without evidence of scar or ischemia.  2. No focal wall motion abnormalities.  3. Left ventricular ejection fraction 50%  4. Low-risk stress test findings*.  *2012 Appropriate Use Criteria for Coronary Revascularization Focused Update: J Am Coll Cardiol. 3149;70(2):637-858. http://content.airportbarriers.com.aspx?articleid=1201161   Electronically Signed   By: Rozann Lesches M.D.   On: 07/15/2014 15:47   Dg Chest Portable 1 View  07/14/2014   CLINICAL DATA:  Left chest pain  EXAM: PORTABLE CHEST - 1 VIEW  COMPARISON:  None.  FINDINGS: The lungs are clear and negative for focal airspace consolidation, pulmonary edema or suspicious pulmonary nodule. No pleural effusion or pneumothorax. Cardiac and mediastinal contours are within normal limits. No acute fracture or lytic or blastic osseous lesions. The visualized upper abdominal bowel gas pattern is unremarkable.  IMPRESSION: No active cardiopulmonary disease.   Electronically  Signed   By: Jacqulynn Cadet M.D.   On: 07/14/2014 09:37    Microbiology: No results found for this or any previous visit (from the past 240 hour(s)).   Labs: Basic Metabolic Panel:  Recent Labs Lab 07/14/14 0922  NA 141  K 4.3  CL 106  CO2 25  GLUCOSE 101*  BUN 19  CREATININE 1.19  CALCIUM 9.1   Liver Function Tests: No results found for this basename: AST, ALT, ALKPHOS, BILITOT, PROT, ALBUMIN,  in the last 168 hours No results found for this basename: LIPASE, AMYLASE,  in the last 168 hours No results found for  this basename: AMMONIA,  in the last 168 hours CBC:  Recent Labs Lab 07/14/14 0922  WBC 5.6  NEUTROABS 3.5  HGB 15.4  HCT 43.6  MCV 86.7  PLT 176   Cardiac Enzymes:  Recent Labs Lab 07/14/14 0922 07/14/14 1542 07/14/14 1913 07/14/14 2100  TROPONINI <0.30 <0.30 <0.30 <0.30   BNP: BNP (last 3 results) No results found for this basename: PROBNP,  in the last 8760 hours CBG: No results found for this basename: GLUCAP,  in the last 168 hours     Signed:  Donaciano Range  [facility] 07/15/2014, 8:43 PM

## 2014-07-15 NOTE — Progress Notes (Signed)
   Please refer to full report of today's Myoview and echocardiogram. Overall low-risk findings, no obvious ischemia, and normal LVEF. No further cardiac testing anticipated at this time. I discussed with Dr. Roderic Palau. Patient should keep close followup with Dr. Pleas Koch, and we can certainly see him back if the need arises.  Satira Sark, M.D., F.A.C.C.

## 2014-07-15 NOTE — Progress Notes (Signed)
Utilization review completed.  

## 2014-07-17 ENCOUNTER — Ambulatory Visit (HOSPITAL_COMMUNITY): Payer: BC Managed Care – PPO | Attending: Family Medicine

## 2014-07-30 ENCOUNTER — Other Ambulatory Visit (HOSPITAL_COMMUNITY): Payer: Self-pay | Admitting: Family Medicine

## 2014-07-30 DIAGNOSIS — N62 Hypertrophy of breast: Secondary | ICD-10-CM

## 2014-08-11 ENCOUNTER — Ambulatory Visit (HOSPITAL_COMMUNITY)
Admission: RE | Admit: 2014-08-11 | Discharge: 2014-08-11 | Disposition: A | Discharge status: Home / Self Care | Payer: BC Managed Care – PPO | Source: Ambulatory Visit | Attending: Family Medicine | Admitting: Family Medicine

## 2014-08-11 DIAGNOSIS — N62 Hypertrophy of breast: Secondary | ICD-10-CM

## 2015-03-04 ENCOUNTER — Other Ambulatory Visit (HOSPITAL_COMMUNITY): Payer: Self-pay | Admitting: Neurosurgery

## 2015-03-04 DIAGNOSIS — M5412 Radiculopathy, cervical region: Secondary | ICD-10-CM

## 2015-03-05 ENCOUNTER — Ambulatory Visit
Admission: RE | Admit: 2015-03-05 | Discharge: 2015-03-05 | Disposition: A | Discharge status: Home / Self Care | Payer: BLUE CROSS/BLUE SHIELD | Source: Ambulatory Visit | Attending: Neurosurgery | Admitting: Neurosurgery

## 2015-03-05 DIAGNOSIS — M5412 Radiculopathy, cervical region: Secondary | ICD-10-CM

## 2017-04-09 ENCOUNTER — Ambulatory Visit: Payer: BLUE CROSS/BLUE SHIELD | Attending: Orthopedic Surgery | Admitting: Physical Therapy

## 2017-04-09 DIAGNOSIS — M25512 Pain in left shoulder: Secondary | ICD-10-CM | POA: Diagnosis not present

## 2017-04-09 DIAGNOSIS — G8929 Other chronic pain: Secondary | ICD-10-CM | POA: Insufficient documentation

## 2017-04-09 DIAGNOSIS — M25612 Stiffness of left shoulder, not elsewhere classified: Secondary | ICD-10-CM | POA: Insufficient documentation

## 2017-04-09 NOTE — Therapy (Deleted)
[facility] [place], [place], [address] Phone: [phone]   Fax:  [phone]  Patient Details  Name: Steve Stone MRN: [id number] Date of Birth: Dec 01, 1960 Referring Provider:  [name], MD  Encounter Date: 04/09/2017   Paulyne Mooty, [place] 04/09/2017, 2:59 PM  [facility] [address] [place], [place], [address] Phone: [phone]   Fax:  [phone]

## 2017-04-09 NOTE — Therapy (Signed)
[facility] [place], [place], [address] Phone: [phone]   Fax:  [phone]  Physical Therapy Evaluation  Patient Details  Name: Steve Stone MRN: [id number] Date of Birth: [birth year] Referring Provider: [name] MD.  Encounter Date: 04/09/2017      PT End of Session - 04/09/17 1445    Visit Number 1   Number of Visits 12   Date for PT Re-Evaluation 05/07/17   PT Start Time 0150   PT Stop Time 0240   PT Time Calculation (min) 50 min   Activity Tolerance Patient tolerated treatment well   Behavior During Therapy [facility] for tasks assessed/performed      Past Medical History:  Diagnosis Date  . Esophageal reflux   . Hyperlipidemia     Past Surgical History:  Procedure Laterality Date  . APPENDECTOMY    . Arthroscopic knee surgery Right   . CHOLECYSTECTOMY    . HERNIA REPAIR      There were no vitals filed for this visit.       Subjective Assessment - 04/09/17 1421    Subjective The patient reports a 2 year h/o left shoulder pain and treatments of cortisone shots.  The patient eventually underwent a left open repair of his left RTC on 03/14/17.  he wore a sling for 2 weeks.  He has been advised by his surgeron to attempt lifting his left shoulder and perform wall climbs as well.  He states:  "It doesn't move much."  His resting pain-level is a 3/10 but higher 6-7+/10) with movement.  Rest decreases his pain.   Patient Stated Goals Use left arm again.   Currently in Pain? Yes   Pain Score 3    Pain Location Shoulder   Pain Orientation Left   Pain Descriptors / Indicators Aching;Throbbing   Pain Onset 1 to 4 weeks ago   Pain Frequency Constant   Aggravating Factors  See above.   Pain Relieving Factors See above.            [facility] PT Assessment - 04/09/17 0001      Assessment   Medical Diagnosis Left open rotator cuff repair.   Referring Provider [name] MD.   Onset Date/Surgical Date --   03/14/17 (surgery date).     Precautions   Precautions --  Begin with left shoulder PROM, P-AROM.     Restrictions   Weight Bearing Restrictions No     Balance Screen   Has the patient fallen in the past 6 months No   Has the patient had a decrease in activity level because of a fear of falling?  No   Is the patient reluctant to leave their home because of a fear of falling?  No     Home Environment   Living Environment Private residence     Prior Function   Level of Independence Independent     Observation/Other Assessments   Observations Left shoulder incisional site looks great.     Posture/Postural Control   Posture/Postural Control --   Posture Comments Left arm held against side in protective posture.     ROM / Strength   AROM / PROM / Strength PROM     PROM   Overall PROM Comments In supine:  Left shoulder passive flexion to 84 degrees; ER= 0 degrees and IR to abdomen.     Palpation   Palpation comment CC is referred pain into left middle deltoid region.  Ambulation/Gait   Gait Comments WNL.            Objective measurements completed on examination: See above findings.          [facility] Adult PT Treatment/Exercise - 04/09/17 0001      Modalities   Modalities Electrical Stimulation;Moist Heat     Moist Heat Therapy   Number Minutes Moist Heat 20 Minutes   Moist Heat Location --  Left shoulder.     Acupuncturist Location Left shoulder.   Electrical Stimulation Action Non-motoric IFC   Electrical Stimulation Parameters 80-150 Hz x 20 minutes.   Electrical Stimulation Goals Pain                PT Education - 04/09/17 1418    Education provided Yes   Education Details HEP.   Person(s) Educated Patient   Methods Explanation;Demonstration;Tactile cues;Verbal cues;Handout   Comprehension Tactile cues required;Verbal cues required;Returned demonstration;Verbalized understanding;Need further  instruction             PT Long Term Goals - 04/09/17 1455      PT LONG TERM GOAL #1   Title Independent with a HEP.   Time 4   Period Weeks   Status New     PT LONG TERM GOAL #2   Title Active left shoulder flexion to 150 degrees so the patient can easily reach overhead   Time 4   Period Weeks   Status New     PT LONG TERM GOAL #3   Title Active ER to 70 degrees+ to allow for easily donning/doffing of apparel   Time 4   Period Weeks   Status New     PT LONG TERM GOAL #4   Title Increase ROM so patient is able to reach behind back to L3.   Time 4   Period Weeks   Status New     PT LONG TERM GOAL #5   Title Increase left shoulder strength to a solid 4+/5 to increase stability for performance of functional activities   Time 4   Period Weeks   Status New     PT LONG TERM GOAL #6   Title Perform ADL's with pain not > 2-3/10.   Time 4   Period Weeks   Status New                Plan - 04/09/17 1449    Clinical Impression Statement The patient prsents to OPPT s/p left open rotator cuff repair surgery performed on 03/14/17.  He has been attempting home exercises but has not improved much. He was shown an additional hoem exercise today to improve ER. He is significantly limited with regards to left shoulder range of motion and is unable to perform ADL's.  Patient will benefit from skilled physical therapy.     History and Personal Factors relevant to plan of care: 2 year h/o left shoulder pain.   Clinical Presentation Stable   Clinical Decision Making Low   Rehab Potential Good   PT Frequency 3x / week   PT Duration 4 weeks   PT Treatment/Interventions ADLs/Self Care Home Management;Cryotherapy;Electrical Stimulation;Moist Heat;Ultrasound;Therapeutic exercise;Therapeutic activities;Patient/family education;Manual techniques;Passive range of motion;Vasopneumatic Device   PT Next Visit Plan Begin with P and P-AROM to patient left shoulder.  [place] PRN.       Patient will benefit from skilled therapeutic intervention in order to improve the following deficits and impairments:  Decreased activity tolerance, Decreased range of motion,  Pain  Visit Diagnosis: Chronic left shoulder pain - Plan: PT plan of care cert/re-cert  Stiffness of left shoulder, not elsewhere classified - Plan: PT plan of care cert/re-cert     Problem List Patient Active Problem List   Diagnosis Date Noted  . Mixed hyperlipidemia 07/15/2014  . Emotional stress 07/15/2014  . Precordial pain 07/14/2014    Lorrain Rivers, [place] MPT 04/09/2017, 2:59 PM  [facility] [address] [place], [place], [address] Phone: [phone]   Fax:  [phone]  Name: Steve Stone MRN: [id number] Date of Birth: Mar 15, 1961

## 2017-04-11 ENCOUNTER — Ambulatory Visit: Payer: BLUE CROSS/BLUE SHIELD | Admitting: Physical Therapy

## 2017-04-11 ENCOUNTER — Encounter: Payer: Self-pay | Admitting: Physical Therapy

## 2017-04-11 DIAGNOSIS — M25612 Stiffness of left shoulder, not elsewhere classified: Secondary | ICD-10-CM

## 2017-04-11 DIAGNOSIS — M25512 Pain in left shoulder: Principal | ICD-10-CM

## 2017-04-11 DIAGNOSIS — G8929 Other chronic pain: Secondary | ICD-10-CM

## 2017-04-11 NOTE — Therapy (Signed)
[facility] [place], [place], [address] Phone: [phone]   Fax:  [phone]  Physical Therapy Treatment  Patient Details  Name: Steve Stone MRN: [id number] Date of Birth: 06/24/1961 Referring Provider: [name] MD.  Encounter Date: 04/11/2017      PT End of Session - 04/11/17 1504    Visit Number 2   Number of Visits 12   Date for PT Re-Evaluation 05/07/17   PT Start Time 3536   PT Stop Time 1526   PT Time Calculation (min) 41 min   Activity Tolerance Patient tolerated treatment well   Behavior During Therapy [facility] for tasks assessed/performed      Past Medical History:  Diagnosis Date  . Esophageal reflux   . Hyperlipidemia     Past Surgical History:  Procedure Laterality Date  . APPENDECTOMY    . Arthroscopic knee surgery Right   . CHOLECYSTECTOMY    . HERNIA REPAIR      There were no vitals filed for this visit.      Subjective Assessment - 04/11/17 1450    Subjective Patient reported ongoing pain in shoulder, no sling per MD   Patient Stated Goals Use left arm again.   Currently in Pain? Yes   Pain Score 6    Pain Location Shoulder   Pain Orientation Left   Pain Descriptors / Indicators Aching;Sore   Pain Type Surgical pain   Pain Onset 1 to 4 weeks ago   Pain Frequency Constant   Aggravating Factors  any movement   Pain Relieving Factors rest            [facility] PT Assessment - 04/11/17 0001      ROM / Strength   AROM / PROM / Strength PROM     PROM   PROM Assessment Site Shoulder   Right/Left Shoulder Right   Right Shoulder Flexion 87 Degrees   Right Shoulder External Rotation 30 Degrees                     [facility] Adult PT Treatment/Exercise - 04/11/17 0001      Moist Heat Therapy   Number Minutes Moist Heat 15 Minutes   Moist Heat Location Shoulder     Electrical Stimulation   Electrical Stimulation Location Left shoulder.   Electrical Stimulation  Action IFC   Electrical Stimulation Parameters 80-150hz  x68min   Electrical Stimulation Goals Pain     Manual Therapy   Manual Therapy Passive ROM   Passive ROM gentle PROM for right shoulder flexion/ER/IR with gentle holds                     PT Long Term Goals - 04/11/17 1515      PT LONG TERM GOAL #1   Title Independent with a HEP.   Time 4   Period Weeks   Status On-going     PT LONG TERM GOAL #2   Title Active left shoulder flexion to 150 degrees so the patient can easily reach overhead   Time 4   Period Weeks   Status On-going     PT LONG TERM GOAL #3   Title Active ER to 70 degrees+ to allow for easily donning/doffing of apparel   Time 4   Period Weeks   Status On-going     PT LONG TERM GOAL #4   Title Increase ROM so patient is able to reach behind back to L3.  Time 4   Period Weeks   Status On-going     PT LONG TERM GOAL #5   Title Increase left shoulder strength to a solid 4+/5 to increase stability for performance of functional activities   Time 4   Period Weeks   Status On-going     PT LONG TERM GOAL #6   Title Perform ADL's with pain not > 2-3/10.   Time 4   Period Weeks   Status On-going               Plan - 04/11/17 1516    Clinical Impression Statement Patient tolerated treatment fairly well. Patient has increased guarding and spasms with ROM. Patient improved ROM for left shoulder after manual ROM today. Patient current goals progressing yrt ongoing due to ROM, pain and strength deficts.    Rehab Potential Good   Clinical Impairments Affecting Rehab Potential surgery 03/14/17 and current 6 weeks 04/11/17   PT Frequency 3x / week   PT Duration 4 weeks   PT Treatment/Interventions ADLs/Self Care Home Management;Cryotherapy;Electrical Stimulation;Moist Heat;Ultrasound;Therapeutic exercise;Therapeutic activities;Patient/family education;Manual techniques;Passive range of motion;Vasopneumatic Device   PT Next Visit Plan Begin with  P and P-AROM to patient left shoulder.  Moadlaites PRN. (MD. Gladstone Lighter 04/11/17) send MD note next treatment   Consulted and Agree with Plan of Care Patient      Patient will benefit from skilled therapeutic intervention in order to improve the following deficits and impairments:  Decreased activity tolerance, Decreased range of motion, Pain  Visit Diagnosis: Chronic left shoulder pain  Stiffness of left shoulder, not elsewhere classified     Problem List Patient Active Problem List   Diagnosis Date Noted  . Mixed hyperlipidemia 07/15/2014  . Emotional stress 07/15/2014  . Precordial pain 07/14/2014    Laiken Nohr P, PTA 04/11/2017, 3:28 PM  [facility] [place], [place], [address] Phone: [phone]   Fax:  [phone]  Name: Steve Stone MRN: [id number] Date of Birth: Nov 14, 1960

## 2017-04-17 ENCOUNTER — Ambulatory Visit: Payer: BLUE CROSS/BLUE SHIELD | Admitting: Physical Therapy

## 2017-04-17 DIAGNOSIS — G8929 Other chronic pain: Secondary | ICD-10-CM

## 2017-04-17 DIAGNOSIS — M25512 Pain in left shoulder: Secondary | ICD-10-CM | POA: Diagnosis not present

## 2017-04-17 DIAGNOSIS — M25612 Stiffness of left shoulder, not elsewhere classified: Secondary | ICD-10-CM

## 2017-04-17 NOTE — Therapy (Signed)
[facility] [place], [place], [address] Phone: [phone]   Fax:  [phone]  Physical Therapy Treatment  Patient Details  Name: Steve Stone MRN: [id number] Date of Birth: 30-Oct-1961 Referring Provider: [name] MD.  Encounter Date: 04/17/2017      PT End of Session - 04/17/17 1756    Visit Number 3   Number of Visits 12   Date for PT Re-Evaluation 05/07/17   PT Start Time 0400   PT Stop Time 0452   PT Time Calculation (min) 52 min   Activity Tolerance Patient tolerated treatment well   Behavior During Therapy [facility] for tasks assessed/performed      Past Medical History:  Diagnosis Date  . Esophageal reflux   . Hyperlipidemia     Past Surgical History:  Procedure Laterality Date  . APPENDECTOMY    . Arthroscopic knee surgery Right   . CHOLECYSTECTOMY    . HERNIA REPAIR      There were no vitals filed for this visit.      Subjective Assessment - 04/17/17 1756    Subjective I'm hurting some today.   Pain Score 6    Pain Location Shoulder   Pain Orientation Left   Pain Descriptors / Indicators Aching;Sore   Pain Onset 1 to 4 weeks ago                         [facility] Adult PT Treatment/Exercise - 04/17/17 0001      Modalities   Modalities Electrical Stimulation     Moist Heat Therapy   Number Minutes Moist Heat 17 Minutes   Moist Heat Location --  Left shoulder.     Acupuncturist Location --  Left shoulder.   Electrical Stimulation Action IFC   Electrical Stimulation Parameters 80-150 Hz x 17 minutes.   Electrical Stimulation Goals Pain     Manual Therapy   Manual Therapy Passive ROM   Passive ROM PROM (flex and ER) in supine to patient's left shoulder including LLLDS into ER (total 23 minutes).                     PT Long Term Goals - 04/11/17 1515      PT LONG TERM GOAL #1   Title Independent with a HEP.   Time 4    Period Weeks   Status On-going     PT LONG TERM GOAL #2   Title Active left shoulder flexion to 150 degrees so the patient can easily reach overhead   Time 4   Period Weeks   Status On-going     PT LONG TERM GOAL #3   Title Active ER to 70 degrees+ to allow for easily donning/doffing of apparel   Time 4   Period Weeks   Status On-going     PT LONG TERM GOAL #4   Title Increase ROM so patient is able to reach behind back to L3.   Time 4   Period Weeks   Status On-going     PT LONG TERM GOAL #5   Title Increase left shoulder strength to a solid 4+/5 to increase stability for performance of functional activities   Time 4   Period Weeks   Status On-going     PT LONG TERM GOAL #6   Title Perform ADL's with pain not > 2-3/10.   Time 4   Period Weeks  Status On-going               Plan - 04/17/17 1800    Clinical Impression Statement Patient with significant left shoulder pain and muscle guarding even with gentle range of motion into flexion.      Patient will benefit from skilled therapeutic intervention in order to improve the following deficits and impairments:  Decreased activity tolerance, Decreased range of motion, Pain  Visit Diagnosis: Chronic left shoulder pain  Stiffness of left shoulder, not elsewhere classified     Problem List Patient Active Problem List   Diagnosis Date Noted  . Mixed hyperlipidemia 07/15/2014  . Emotional stress 07/15/2014  . Precordial pain 07/14/2014    Sharika Mosquera, [place]  MPT 04/17/2017, 6:02 PM  [facility] [address] [place], [place], [address] Phone: [phone]   Fax:  [phone]  Name: Steve Stone MRN: [id number] Date of Birth: August 05, 1961

## 2017-04-19 ENCOUNTER — Ambulatory Visit: Payer: BLUE CROSS/BLUE SHIELD | Admitting: *Deleted

## 2017-04-19 DIAGNOSIS — G8929 Other chronic pain: Secondary | ICD-10-CM

## 2017-04-19 DIAGNOSIS — M25612 Stiffness of left shoulder, not elsewhere classified: Secondary | ICD-10-CM

## 2017-04-19 DIAGNOSIS — M25512 Pain in left shoulder: Principal | ICD-10-CM

## 2017-04-19 NOTE — Therapy (Signed)
[facility] [place], [place], [address] Phone: [phone]   Fax:  [phone]  Physical Therapy Treatment  Patient Details  Name: EMILY FORSE MRN: [id number] Date of Birth: 27-Jun-1961 Referring Provider: [name] MD.  Encounter Date: 04/19/2017      PT End of Session - 04/19/17 1538    Visit Number 4   Number of Visits 12   Date for PT Re-Evaluation 05/07/17   PT Start Time 5361   PT Stop Time 4431   PT Time Calculation (min) 50 min      Past Medical History:  Diagnosis Date  . Esophageal reflux   . Hyperlipidemia     Past Surgical History:  Procedure Laterality Date  . APPENDECTOMY    . Arthroscopic knee surgery Right   . CHOLECYSTECTOMY    . HERNIA REPAIR      There were no vitals filed for this visit.      Subjective Assessment - 04/19/17 1537    Subjective I'm hurting some today. Went to MD and they said to cont PT.   Patient Stated Goals Use left arm again.   Currently in Pain? Yes   Pain Score 6    Pain Location Shoulder   Pain Orientation Left   Pain Type Surgical pain   Pain Onset 1 to 4 weeks ago   Pain Frequency Constant                         [facility] Adult PT Treatment/Exercise - 04/19/17 0001      Exercises   Exercises Shoulder     Shoulder Exercises: Seated   Other Seated Exercises table top and thigh slides for HEP     Modalities   Modalities Electrical Stimulation     Moist Heat Therapy   Number Minutes Moist Heat 15 Minutes   Moist Heat Location Shoulder     Electrical Stimulation   Electrical Stimulation Location IFC x 15 mins 80-150hz   LT shldr   Electrical Stimulation Goals Pain     Manual Therapy   Manual Therapy Passive ROM   Passive ROM  PROM for right shoulder flexion/ER/IR with  holds at end-range.                       PT Long Term Goals - 04/11/17 1515      PT LONG TERM GOAL #1   Title Independent with a HEP.   Time 4   Period Weeks   Status On-going     PT LONG TERM GOAL #2   Title Active left shoulder flexion to 150 degrees so the patient can easily reach overhead   Time 4   Period Weeks   Status On-going     PT LONG TERM GOAL #3   Title Active ER to 70 degrees+ to allow for easily donning/doffing of apparel   Time 4   Period Weeks   Status On-going     PT LONG TERM GOAL #4   Title Increase ROM so patient is able to reach behind back to L3.   Time 4   Period Weeks   Status On-going     PT LONG TERM GOAL #5   Title Increase left shoulder strength to a solid 4+/5 to increase stability for performance of functional activities   Time 4   Period Weeks   Status On-going     PT LONG TERM GOAL #6  Title Perform ADL's with pain not > 2-3/10.   Time 4   Period Weeks   Status On-going               Plan - 04/19/17 1538    Clinical Impression Statement Pt arrived today doing fairly well. He had F/U with MD this AM and was instructed in wall slide and AAROM with RT UE.Marland Kitchen Pt was instructed in table top and thigh slides today and did well. PROM/PAROM for flexion IR, and ER performed. ER to 40, Fleion 115, and IR to 70 degrees   Clinical Presentation Stable   Clinical Decision Making Low   Rehab Potential Good   Clinical Impairments Affecting Rehab Potential surgery 03/14/17 and current 4 weeks 04/11/17   PT Frequency 3x / week   PT Duration 4 weeks   PT Treatment/Interventions ADLs/Self Care Home Management;Cryotherapy;Electrical Stimulation;Moist Heat;Ultrasound;Therapeutic exercise;Therapeutic activities;Patient/family education;Manual techniques;Passive range of motion;Vasopneumatic Device   PT Next Visit Plan Begin with P and P-AROM to patient left shoulder.  Moadlaites PRN. (MD. Gladstone Lighter 04/11/17) send MD note next treatment   Consulted and Agree with Plan of Care Patient      Patient will benefit from skilled therapeutic intervention in order to improve the following deficits and  impairments:  Decreased activity tolerance, Decreased range of motion, Pain  Visit Diagnosis: Chronic left shoulder pain  Stiffness of left shoulder, not elsewhere classified     Problem List Patient Active Problem List   Diagnosis Date Noted  . Mixed hyperlipidemia 07/15/2014  . Emotional stress 07/15/2014  . Precordial pain 07/14/2014    Quantia Grullon,CHRIS, PTA 04/19/2017, 5:16 PM  [facility] [place], [place], [address] Phone: [phone]   Fax:  [phone]  Name: CAYLE THUNDER MRN: [id number] Date of Birth: [birth year]

## 2017-04-24 ENCOUNTER — Encounter: Payer: Self-pay | Admitting: Physical Therapy

## 2017-04-24 ENCOUNTER — Ambulatory Visit: Payer: BLUE CROSS/BLUE SHIELD | Admitting: Physical Therapy

## 2017-04-24 DIAGNOSIS — G8929 Other chronic pain: Secondary | ICD-10-CM

## 2017-04-24 DIAGNOSIS — M25512 Pain in left shoulder: Secondary | ICD-10-CM | POA: Diagnosis not present

## 2017-04-24 DIAGNOSIS — M25612 Stiffness of left shoulder, not elsewhere classified: Secondary | ICD-10-CM

## 2017-04-24 NOTE — Therapy (Signed)
[facility] [place], [place], [address] Phone: [phone]   Fax:  [phone]  Physical Therapy Treatment  Patient Details  Name: Steve Stone MRN: [id number] Date of Birth: 1961/01/02 Referring Provider: [name] MD.  Encounter Date: 04/24/2017      PT End of Session - 04/24/17 1520    Visit Number 5   Number of Visits 12   Date for PT Re-Evaluation 05/07/17   PT Start Time 1520   PT Stop Time 1603   PT Time Calculation (min) 43 min   Activity Tolerance Patient tolerated treatment well   Behavior During Therapy [facility] for tasks assessed/performed      Past Medical History:  Diagnosis Date  . Esophageal reflux   . Hyperlipidemia     Past Surgical History:  Procedure Laterality Date  . APPENDECTOMY    . Arthroscopic knee surgery Right   . CHOLECYSTECTOMY    . HERNIA REPAIR      There were no vitals filed for this visit.      Subjective Assessment - 04/24/17 1519    Subjective Reports that he hurts with certain positions but has been trying to use it. Reports that shoulder has loosened up some.   Patient Stated Goals Use left arm again.   Currently in Pain? Yes   Pain Score 2    Pain Location Shoulder   Pain Orientation Left   Pain Descriptors / Indicators Discomfort   Pain Type Surgical pain   Pain Onset 1 to 4 weeks ago            [facility] PT Assessment - 04/24/17 0001      Assessment   Medical Diagnosis Left open rotator cuff repair.   Onset Date/Surgical Date 03/14/17   Next MD Visit 05/10/2017     Restrictions   Weight Bearing Restrictions No                     [facility] Adult PT Treatment/Exercise - 04/24/17 0001      Modalities   Modalities Electrical Stimulation;Moist Heat     Moist Heat Therapy   Number Minutes Moist Heat 15 Minutes   Moist Heat Location Shoulder     Electrical Stimulation   Electrical Stimulation Location L shoulder   Electrical Stimulation  Action IFC   Electrical Stimulation Parameters 1-10 hz x15 min   Electrical Stimulation Goals Pain     Manual Therapy   Manual Therapy Passive ROM   Passive ROM PROM of R shoulder into flex/ER/IR with gentle holds at end range with intermittant oscillations to promote relaxation                     PT Long Term Goals - 04/11/17 1515      PT LONG TERM GOAL #1   Title Independent with a HEP.   Time 4   Period Weeks   Status On-going     PT LONG TERM GOAL #2   Title Active left shoulder flexion to 150 degrees so the patient can easily reach overhead   Time 4   Period Weeks   Status On-going     PT LONG TERM GOAL #3   Title Active ER to 70 degrees+ to allow for easily donning/doffing of apparel   Time 4   Period Weeks   Status On-going     PT LONG TERM GOAL #4   Title Increase ROM so patient is able to reach  behind back to L3.   Time 4   Period Weeks   Status On-going     PT LONG TERM GOAL #5   Title Increase left shoulder strength to a solid 4+/5 to increase stability for performance of functional activities   Time 4   Period Weeks   Status On-going     PT LONG TERM GOAL #6   Title Perform ADL's with pain not > 2-3/10.   Time 4   Period Weeks   Status On-going               Plan - 04/24/17 1555    Clinical Impression Statement Patient tolerated today's treatment well although facial grimacing and patient reports of discomfort at end range PROM of L shoulder into flexion. End feel tightened as end range PROM flexion approached. Oscillations completed intermittantly to LUE to promote relaxation and reduce pain. No pain or discomfort reported with PROM of R shoulder into ER/IR. Goals remain on-going secondary to protocol limitations. Normal modalities response noted following removal of the modalities.   Rehab Potential Good   Clinical Impairments Affecting Rehab Potential surgery 03/14/17 and current 4 weeks 04/11/17   PT Frequency 3x / week   PT  Duration 4 weeks   PT Treatment/Interventions ADLs/Self Care Home Management;Cryotherapy;Electrical Stimulation;Moist Heat;Ultrasound;Therapeutic exercise;Therapeutic activities;Patient/family education;Manual techniques;Passive range of motion;Vasopneumatic Device   PT Next Visit Plan Continue with L RCR protocol with modalities PRN per MPT POC.   Consulted and Agree with Plan of Care Patient      Patient will benefit from skilled therapeutic intervention in order to improve the following deficits and impairments:  Decreased activity tolerance, Decreased range of motion, Pain  Visit Diagnosis: Chronic left shoulder pain  Stiffness of left shoulder, not elsewhere classified     Problem List Patient Active Problem List   Diagnosis Date Noted  . Mixed hyperlipidemia 07/15/2014  . Emotional stress 07/15/2014  . Precordial pain 07/14/2014    Wynelle Fanny, PTA 04/24/2017, 4:07 PM  [facility] [address] [place], [place], [address] Phone: [phone]   Fax:  [phone]  Name: Steve Stone MRN: [id number] Date of Birth: 08/14/1961

## 2017-04-26 ENCOUNTER — Ambulatory Visit: Payer: BLUE CROSS/BLUE SHIELD | Admitting: Physical Therapy

## 2017-04-26 ENCOUNTER — Encounter: Payer: Self-pay | Admitting: Physical Therapy

## 2017-04-26 DIAGNOSIS — M25612 Stiffness of left shoulder, not elsewhere classified: Secondary | ICD-10-CM

## 2017-04-26 DIAGNOSIS — M25512 Pain in left shoulder: Secondary | ICD-10-CM | POA: Diagnosis not present

## 2017-04-26 DIAGNOSIS — G8929 Other chronic pain: Secondary | ICD-10-CM

## 2017-04-26 NOTE — Therapy (Signed)
[facility] [place], [place], [address] Phone: [phone]   Fax:  [phone]  Physical Therapy Treatment  Patient Details  Name: Steve Stone MRN: [id number] Date of Birth: 10/22/1961 Referring Provider: [name] MD.  Encounter Date: 04/26/2017      PT End of Session - 04/26/17 1551    Visit Number 6   Number of Visits 12   Date for PT Re-Evaluation 05/07/17   PT Start Time 1518   PT Stop Time 1603   PT Time Calculation (min) 45 min   Activity Tolerance Patient tolerated treatment well   Behavior During Therapy [facility] for tasks assessed/performed      Past Medical History:  Diagnosis Date  . Esophageal reflux   . Hyperlipidemia     Past Surgical History:  Procedure Laterality Date  . APPENDECTOMY    . Arthroscopic knee surgery Right   . CHOLECYSTECTOMY    . HERNIA REPAIR      There were no vitals filed for this visit.      Subjective Assessment - 04/26/17 1518    Subjective (P)  Reports that he has some pain for 2 hours after each treatment. Reports that his shoulder is a little better but still tight.   Patient Stated Goals (P)  Use left arm again.   Currently in Pain? (P)  Yes   Pain Score (P)  1    Pain Location (P)  Shoulder   Pain Orientation (P)  Left   Pain Descriptors / Indicators (P)  Tightness   Pain Type (P)  Surgical pain            [facility] PT Assessment - 04/26/17 0001      Assessment   Medical Diagnosis Left open rotator cuff repair.   Onset Date/Surgical Date 03/14/17   Next MD Visit 05/10/2017     Restrictions   Weight Bearing Restrictions No                     [facility] Adult PT Treatment/Exercise - 04/26/17 0001      Modalities   Modalities Electrical Stimulation;Moist Heat     Moist Heat Therapy   Number Minutes Moist Heat 15 Minutes   Moist Heat Location Shoulder     Electrical Stimulation   Electrical Stimulation Location L shoulder   Electrical  Stimulation Action IFC   Electrical Stimulation Parameters 1-10 hz x15 min   Electrical Stimulation Goals Pain     Manual Therapy   Manual Therapy Passive ROM   Passive ROM PROM of R shoulder into flex/ER/IR with gentle holds at end range with intermittant oscillations to promote relaxation                     PT Long Term Goals - 04/11/17 1515      PT LONG TERM GOAL #1   Title Independent with a HEP.   Time 4   Period Weeks   Status On-going     PT LONG TERM GOAL #2   Title Active left shoulder flexion to 150 degrees so the patient can easily reach overhead   Time 4   Period Weeks   Status On-going     PT LONG TERM GOAL #3   Title Active ER to 70 degrees+ to allow for easily donning/doffing of apparel   Time 4   Period Weeks   Status On-going     PT LONG TERM GOAL #4  Title Increase ROM so patient is able to reach behind back to L3.   Time 4   Period Weeks   Status On-going     PT LONG TERM GOAL #5   Title Increase left shoulder strength to a solid 4+/5 to increase stability for performance of functional activities   Time 4   Period Weeks   Status On-going     PT LONG TERM GOAL #6   Title Perform ADL's with pain not > 2-3/10.   Time 4   Period Weeks   Status On-going               Plan - 04/26/17 1553    Clinical Impression Statement Patient tolerated today's treatment well today with very intermittant expressions of discomfort with PROM. PROM of L shoulder completed into flexion/ER/IR with tightness at end range flexion. Oscillations utilized sparingly today during PROM to promote relaxation and reduction of pain or discomfort. Goals remain on-going secondary to protocol limitations. Normal modalities response noted following removal of the modalities.   Rehab Potential Good   Clinical Impairments Affecting Rehab Potential surgery 03/14/17 and current 6 weeks 04/25/17   PT Frequency 3x / week   PT Duration 4 weeks   PT  Treatment/Interventions ADLs/Self Care Home Management;Cryotherapy;Electrical Stimulation;Moist Heat;Ultrasound;Therapeutic exercise;Therapeutic activities;Patient/family education;Manual techniques;Passive range of motion;Vasopneumatic Device   PT Next Visit Plan Continue with L RCR protocol with modalities PRN per MPT POC.   Consulted and Agree with Plan of Care Patient      Patient will benefit from skilled therapeutic intervention in order to improve the following deficits and impairments:  Decreased activity tolerance, Decreased range of motion, Pain  Visit Diagnosis: Chronic left shoulder pain  Stiffness of left shoulder, not elsewhere classified     Problem List Patient Active Problem List   Diagnosis Date Noted  . Mixed hyperlipidemia 07/15/2014  . Emotional stress 07/15/2014  . Precordial pain 07/14/2014    Wynelle Fanny, PTA 04/26/2017, 4:06 PM  [facility] [address] [place], [place], [address] Phone: [phone]   Fax:  [phone]  Name: Steve Stone MRN: [id number] Date of Birth: June 05, 1961

## 2017-05-01 ENCOUNTER — Ambulatory Visit: Payer: BLUE CROSS/BLUE SHIELD | Admitting: Physical Therapy

## 2017-05-01 DIAGNOSIS — M25512 Pain in left shoulder: Secondary | ICD-10-CM | POA: Diagnosis not present

## 2017-05-01 DIAGNOSIS — M25612 Stiffness of left shoulder, not elsewhere classified: Secondary | ICD-10-CM

## 2017-05-01 DIAGNOSIS — G8929 Other chronic pain: Secondary | ICD-10-CM

## 2017-05-01 NOTE — Therapy (Signed)
[facility] [place], [place], [address] Phone: [phone]   Fax:  [phone]  Physical Therapy Treatment  Patient Details  Name: Steve Stone MRN: [id number] Date of Birth: 03/22/1961 Referring Provider: [name] MD.  Encounter Date: 05/01/2017      PT End of Session - 05/01/17 1556    Visit Number 7   Number of Visits 12   Date for PT Re-Evaluation 05/07/17   PT Start Time 0315   PT Stop Time 0409   PT Time Calculation (min) 54 min   Activity Tolerance Patient tolerated treatment well   Behavior During Therapy [facility] for tasks assessed/performed      Past Medical History:  Diagnosis Date  . Esophageal reflux   . Hyperlipidemia     Past Surgical History:  Procedure Laterality Date  . APPENDECTOMY    . Arthroscopic knee surgery Right   . CHOLECYSTECTOMY    . HERNIA REPAIR      There were no vitals filed for this visit.      Subjective Assessment - 05/01/17 1557    Subjective I can get my left hand on the steering wheel easier now.   Patient Stated Goals Use left arm again.   Pain Score 2    Pain Location Shoulder   Pain Orientation Left   Pain Descriptors / Indicators Discomfort   Pain Type Surgical pain   Pain Onset More than a month ago   Pain Frequency Constant                         [facility] Adult PT Treatment/Exercise - 05/01/17 0001      Modalities   Modalities Moist Heat     Moist Heat Therapy   Number Minutes Moist Heat 20 Minutes   Moist Heat Location --  Left shoulder.     Acupuncturist Location --  Left shoulder.   Electrical Stimulation Action IFC   Electrical Stimulation Parameters 80-150 Hz x 20 minutes.   Electrical Stimulation Goals Pain     Manual Therapy   Manual Therapy Passive ROM   Passive ROM In supine:  PROM into left shoulder flexion and lERE in the plane of the scapula with low load long duration stretching  technique x 23 minutes.                     PT Long Term Goals - 04/11/17 1515      PT LONG TERM GOAL #1   Title Independent with a HEP.   Time 4   Period Weeks   Status On-going     PT LONG TERM GOAL #2   Title Active left shoulder flexion to 150 degrees so the patient can easily reach overhead   Time 4   Period Weeks   Status On-going     PT LONG TERM GOAL #3   Title Active ER to 70 degrees+ to allow for easily donning/doffing of apparel   Time 4   Period Weeks   Status On-going     PT LONG TERM GOAL #4   Title Increase ROM so patient is able to reach behind back to L3.   Time 4   Period Weeks   Status On-going     PT LONG TERM GOAL #5   Title Increase left shoulder strength to a solid 4+/5 to increase stability for performance of functional activities   Time 4  Period Weeks   Status On-going     PT LONG TERM GOAL #6   Title Perform ADL's with pain not > 2-3/10.   Time 4   Period Weeks   Status On-going               Plan - 05/01/17 1619    Clinical Impression Statement Patient still with tightness of left shoulder.  Significant muscle guarding observed today.   PT Next Visit Plan Pulleys; UE ranger.      Patient will benefit from skilled therapeutic intervention in order to improve the following deficits and impairments:  Decreased activity tolerance, Decreased range of motion, Pain  Visit Diagnosis: Chronic left shoulder pain  Stiffness of left shoulder, not elsewhere classified     Problem List Patient Active Problem List   Diagnosis Date Noted  . Mixed hyperlipidemia 07/15/2014  . Emotional stress 07/15/2014  . Precordial pain 07/14/2014    Vanda Waskey, [place] MPT 05/01/2017, 4:21 PM  [facility] [address] [place], [place], [address] Phone: [phone]   Fax:  [phone]  Name: GOLDMAN BIRCHALL MRN: [id number] Date of Birth: [birth year]

## 2017-05-03 ENCOUNTER — Ambulatory Visit: Payer: BLUE CROSS/BLUE SHIELD | Admitting: *Deleted

## 2017-05-03 DIAGNOSIS — M25512 Pain in left shoulder: Secondary | ICD-10-CM | POA: Diagnosis not present

## 2017-05-03 DIAGNOSIS — G8929 Other chronic pain: Secondary | ICD-10-CM

## 2017-05-03 DIAGNOSIS — M25612 Stiffness of left shoulder, not elsewhere classified: Secondary | ICD-10-CM

## 2017-05-03 NOTE — Therapy (Signed)
[facility] [place], [place], [address] Phone: [phone]   Fax:  [phone]  Physical Therapy Treatment  Patient Details  Name: Steve Stone MRN: [id number] Date of Birth: 09-06-1961 Referring Provider: [name] MD.  Encounter Date: 05/03/2017      PT End of Session - 05/03/17 1755    Visit Number 8   Number of Visits 12   Date for PT Re-Evaluation 05/07/17   PT Start Time 7371   PT Stop Time 1605   PT Time Calculation (min) 50 min      Past Medical History:  Diagnosis Date  . Esophageal reflux   . Hyperlipidemia     Past Surgical History:  Procedure Laterality Date  . APPENDECTOMY    . Arthroscopic knee surgery Right   . CHOLECYSTECTOMY    . HERNIA REPAIR      There were no vitals filed for this visit.      Subjective Assessment - 05/03/17 1509    Subjective I can get my left hand on the steering wheel easier now. Doing better   Patient Stated Goals Use left arm again.   Currently in Pain? Yes   Pain Score 1    Pain Location Shoulder   Pain Orientation Left   Pain Descriptors / Indicators Discomfort   Pain Type Surgical pain                         [facility] Adult PT Treatment/Exercise - 05/03/17 0001      Modalities   Modalities Moist Heat     Electrical Stimulation   Electrical Stimulation Location L shoulder   Electrical Stimulation Goals Pain     Manual Therapy   Manual Therapy Passive ROM   Passive ROM PROM of R shoulder into flex 140 degrees/ER 50 degrees  /IR  65 degreeswith gentle holds at end range with intermittant oscillations to promote relaxation         Supine cane press and flexion 3x10 each                PT Long Term Goals - 04/11/17 1515      PT LONG TERM GOAL #1   Title Independent with a HEP.   Time 4   Period Weeks   Status On-going     PT LONG TERM GOAL #2   Title Active left shoulder flexion to 150 degrees so the patient  can easily reach overhead   Time 4   Period Weeks   Status On-going     PT LONG TERM GOAL #3   Title Active ER to 70 degrees+ to allow for easily donning/doffing of apparel   Time 4   Period Weeks   Status On-going     PT LONG TERM GOAL #4   Title Increase ROM so patient is able to reach behind back to L3.   Time 4   Period Weeks   Status On-going     PT LONG TERM GOAL #5   Title Increase left shoulder strength to a solid 4+/5 to increase stability for performance of functional activities   Time 4   Period Weeks   Status On-going     PT LONG TERM GOAL #6   Title Perform ADL's with pain not > 2-3/10.   Time 4   Period Weeks   Status On-going               Plan -  05/03/17 1541    Clinical Impression Statement Pt arrived to clinic today with minimal LT shldr soreness. He was able to perform supine cane press and flexion today with mainly discomfort and instructed for HEP. PROM was performed  for flexion to 140 degrees, ER to50 and IR to 65 degrees. Less guarding today.   Rehab Potential Good   Clinical Impairments Affecting Rehab Potential surgery 03/14/17 and current 6 weeks 04/25/17   PT Frequency 3x / week   PT Duration 4 weeks   PT Treatment/Interventions ADLs/Self Care Home Management;Cryotherapy;Electrical Stimulation;Moist Heat;Ultrasound;Therapeutic exercise;Therapeutic activities;Patient/family education;Manual techniques;Passive range of motion;Vasopneumatic Device   PT Next Visit Plan Pulleys; UE ranger.   Consulted and Agree with Plan of Care Patient      Patient will benefit from skilled therapeutic intervention in order to improve the following deficits and impairments:  Decreased activity tolerance, Decreased range of motion, Pain  Visit Diagnosis: Chronic left shoulder pain  Stiffness of left shoulder, not elsewhere classified     Problem List Patient Active Problem List   Diagnosis Date Noted  . Mixed hyperlipidemia 07/15/2014  . Emotional  stress 07/15/2014  . Precordial pain 07/14/2014    Jaxsyn Azam,CHRIS, PTA 05/03/2017, 5:56 PM  [facility] [address] [place], [place], [address] Phone: [phone]   Fax:  [phone]  Name: Steve Stone MRN: [id number] Date of Birth: [birth year]

## 2017-05-08 ENCOUNTER — Ambulatory Visit: Payer: BLUE CROSS/BLUE SHIELD | Attending: Orthopedic Surgery | Admitting: *Deleted

## 2017-05-08 DIAGNOSIS — M25612 Stiffness of left shoulder, not elsewhere classified: Secondary | ICD-10-CM

## 2017-05-08 DIAGNOSIS — G8929 Other chronic pain: Secondary | ICD-10-CM | POA: Diagnosis present

## 2017-05-08 DIAGNOSIS — M25512 Pain in left shoulder: Secondary | ICD-10-CM | POA: Insufficient documentation

## 2017-05-08 NOTE — Therapy (Signed)
[facility] [place], [place], [address] Phone: [phone]   Fax:  [phone]  Physical Therapy Treatment  Patient Details  Name: Steve Stone MRN: [id number] Date of Birth: 1961/07/09 Referring Provider: [name] MD.  Encounter Date: 05/08/2017      PT End of Session - 05/08/17 1701    Visit Number 9   Number of Visits 12   Date for PT Re-Evaluation 05/07/17   PT Start Time 0569   PT Stop Time 1605   PT Time Calculation (min) 50 min      Past Medical History:  Diagnosis Date  . Esophageal reflux   . Hyperlipidemia     Past Surgical History:  Procedure Laterality Date  . APPENDECTOMY    . Arthroscopic knee surgery Right   . CHOLECYSTECTOMY    . HERNIA REPAIR      There were no vitals filed for this visit.      Subjective Assessment - 05/08/17 1531    Subjective I can get my left hand on the steering wheel easier now. Doing better   Patient Stated Goals Use left arm again.   Currently in Pain? Yes   Pain Score 2    Pain Location Shoulder   Pain Orientation Left   Pain Descriptors / Indicators Discomfort;Sore   Pain Type Surgical pain   Pain Onset More than a month ago   Pain Frequency Constant                         [facility] Adult PT Treatment/Exercise - 05/08/17 0001      Shoulder Exercises: Supine   Other Supine Exercises cane pressups and flexion x20     Shoulder Exercises: Pulleys   Flexion --  5 mins   Other Pulley Exercises UE ranger in standing 3x10 flexion and circles each way     Modalities   Modalities Moist Heat     Moist Heat Therapy   Number Minutes Moist Heat 15 Minutes   Moist Heat Location --  Left shoulder.     Acupuncturist Location L shoulder   Electrical Stimulation Goals Pain     Manual Therapy   Manual Therapy Passive ROM   Passive ROM PROM of R shoulder into flex 140 degrees/ER 50 degrees  /IR  65  degreeswith gentle holds at end range with intermittant oscillations to promote relaxation                     PT Long Term Goals - 04/11/17 1515      PT LONG TERM GOAL #1   Title Independent with a HEP.   Time 4   Period Weeks   Status On-going     PT LONG TERM GOAL #2   Title Active left shoulder flexion to 150 degrees so the patient can easily reach overhead   Time 4   Period Weeks   Status On-going     PT LONG TERM GOAL #3   Title Active ER to 70 degrees+ to allow for easily donning/doffing of apparel   Time 4   Period Weeks   Status On-going     PT LONG TERM GOAL #4   Title Increase ROM so patient is able to reach behind back to L3.   Time 4   Period Weeks   Status On-going     PT LONG TERM GOAL #5   Title Increase  left shoulder strength to a solid 4+/5 to increase stability for performance of functional activities   Time 4   Period Weeks   Status On-going     PT LONG TERM GOAL #6   Title Perform ADL's with pain not > 2-3/10.   Time 4   Period Weeks   Status On-going               Plan - 05/08/17 1656    Clinical Impression Statement Pt arrived today with mainly soreness and stiffness in LT shldr. He is still tender over LT ACJ and around bicep. He did well with AAROM and PROM today and reached 140 degrees of flexion, 50 ER and 65 for IR.Marland Kitchen No LTGs met due to ROMdeficits and pain levels   Rehab Potential Good   Clinical Impairments Affecting Rehab Potential surgery 03/14/17 and current 6 weeks 04/25/17   PT Frequency 3x / week   PT Duration 4 weeks   PT Treatment/Interventions ADLs/Self Care Home Management;Cryotherapy;Electrical Stimulation;Moist Heat;Ultrasound;Therapeutic exercise;Therapeutic activities;Patient/family education;Manual techniques;Passive range of motion;Vasopneumatic Device   PT Next Visit Plan Pulleys; UE ranger.   Consulted and Agree with Plan of Care Patient      Patient will benefit from skilled therapeutic  intervention in order to improve the following deficits and impairments:  Decreased activity tolerance, Decreased range of motion, Pain  Visit Diagnosis: Chronic left shoulder pain  Stiffness of left shoulder, not elsewhere classified     Problem List Patient Active Problem List   Diagnosis Date Noted  . Mixed hyperlipidemia 07/15/2014  . Emotional stress 07/15/2014  . Precordial pain 07/14/2014    RAMSEUR,CHRIS,PTA 05/08/2017, 5:03 PM [place] Applegate MPT [facility] [address] [place], [place], [address] Phone: [phone]   Fax:  [phone]  Name: Steve Stone MRN: [id number] Date of Birth: [birth year]

## 2017-05-10 ENCOUNTER — Ambulatory Visit: Payer: BLUE CROSS/BLUE SHIELD | Admitting: Physical Therapy

## 2017-05-10 ENCOUNTER — Encounter: Payer: Self-pay | Admitting: Physical Therapy

## 2017-05-10 DIAGNOSIS — M25512 Pain in left shoulder: Secondary | ICD-10-CM | POA: Diagnosis not present

## 2017-05-10 DIAGNOSIS — M25612 Stiffness of left shoulder, not elsewhere classified: Secondary | ICD-10-CM

## 2017-05-10 DIAGNOSIS — G8929 Other chronic pain: Secondary | ICD-10-CM

## 2017-05-10 NOTE — Therapy (Signed)
[facility] [place], [place], [address] Phone: [phone]   Fax:  [phone]  Physical Therapy Treatment  Patient Details  Name: Steve Stone MRN: [id number] Date of Birth: [birth year] Referring Provider: [name] MD.  Encounter Date: 05/10/2017      PT End of Session - 05/10/17 1034    Visit Number 10   Number of Visits 12   Date for PT Re-Evaluation 05/07/17   PT Start Time 1031   PT Stop Time 1121   PT Time Calculation (min) 50 min   Activity Tolerance Patient tolerated treatment well   Behavior During Therapy [facility] for tasks assessed/performed      Past Medical History:  Diagnosis Date  . Esophageal reflux   . Hyperlipidemia     Past Surgical History:  Procedure Laterality Date  . APPENDECTOMY    . Arthroscopic knee surgery Right   . CHOLECYSTECTOMY    . HERNIA REPAIR      There were no vitals filed for this visit.      Subjective Assessment - 05/10/17 1029    Subjective Reports that his shoulder is really stiff this morning.   Patient Stated Goals Use left arm again.   Currently in Pain? Yes   Pain Score 3    Pain Location Shoulder   Pain Orientation Left   Pain Descriptors / Indicators Other (Comment)  Stiffness   Pain Type Surgical pain   Pain Onset More than a month ago            [facility] PT Assessment - 05/10/17 0001      Assessment   Medical Diagnosis Left open rotator cuff repair.   Onset Date/Surgical Date 03/14/17   Next MD Visit 05/10/2017     Restrictions   Weight Bearing Restrictions No     ROM / Strength   AROM / PROM / Strength AROM     AROM   Overall AROM  Deficits   AROM Assessment Site Shoulder   Right/Left Shoulder Left   Left Shoulder Flexion 115 Degrees   Left Shoulder Internal Rotation 57 Degrees   Left Shoulder External Rotation 35 Degrees                     [facility] Adult PT Treatment/Exercise - 05/10/17 0001      Shoulder Exercises:  Supine   Protraction AAROM;Both;Other (comment)  4x10 reps   External Rotation AAROM;Left;20 reps   Flexion AAROM;Both;20 reps     Shoulder Exercises: Pulleys   Flexion Other (comment)  x5 min   Other Pulley Exercises UE ranger into flex/circles x20 reps   Other Pulley Exercises Wall ladder (29-31) x10 reps     Modalities   Modalities Electrical Stimulation     Electrical Stimulation   Electrical Stimulation Location L shoulder   Electrical Stimulation Action IFC   Electrical Stimulation Parameters 1-10 hz x15 min   Electrical Stimulation Goals Pain     Manual Therapy   Manual Therapy Passive ROM   Passive ROM PROM of L shoulder into flexion/ER with oscillations to promote relaxation and pain reduction                     PT Long Term Goals - 04/11/17 1515      PT LONG TERM GOAL #1   Title Independent with a HEP.   Time 4   Period Weeks   Status On-going     PT LONG TERM  GOAL #2   Title Active left shoulder flexion to 150 degrees so the patient can easily reach overhead   Time 4   Period Weeks   Status On-going     PT LONG TERM GOAL #3   Title Active ER to 70 degrees+ to allow for easily donning/doffing of apparel   Time 4   Period Weeks   Status On-going     PT LONG TERM GOAL #4   Title Increase ROM so patient is able to reach behind back to L3.   Time 4   Period Weeks   Status On-going     PT LONG TERM GOAL #5   Title Increase left shoulder strength to a solid 4+/5 to increase stability for performance of functional activities   Time 4   Period Weeks   Status On-going     PT LONG TERM GOAL #6   Title Perform ADL's with pain not > 2-3/10.   Time 4   Period Weeks   Status On-going               Plan - 05/10/17 1107    Clinical Impression Statement Patient arrived to treatment with reports of L shoulder stiffness with no abnormal actvities at home other than starting new exercises in PT. Patient able to complete exercises as  directed with facial grimacing intermittantly secondary to discomfort. PROM of L shoulder completed today with firm end feels noted in all directions assessed today. Oscillations of L shoulder completed today to promote relaxation and reduce pain. AROM measurements of L shoulder measured as limited today which may be result of L shoulder stiffness. AROM measurements of L shoulder listed in today's note. Normal modality response noted following removal of the modality.   Rehab Potential Good   Clinical Impairments Affecting Rehab Potential surgery 03/14/17 and current 6 weeks 04/25/17   PT Frequency 3x / week   PT Duration 4 weeks   PT Treatment/Interventions ADLs/Self Care Home Management;Cryotherapy;Electrical Stimulation;Moist Heat;Ultrasound;Therapeutic exercise;Therapeutic activities;Patient/family education;Manual techniques;Passive range of motion;Vasopneumatic Device   PT Next Visit Plan Continue with AAROM exercises per protocol and modalities for pain relief per MPT POC.   Consulted and Agree with Plan of Care Patient      Patient will benefit from skilled therapeutic intervention in order to improve the following deficits and impairments:  Decreased activity tolerance, Decreased range of motion, Pain  Visit Diagnosis: Chronic left shoulder pain  Stiffness of left shoulder, not elsewhere classified     Problem List Patient Active Problem List   Diagnosis Date Noted  . Mixed hyperlipidemia 07/15/2014  . Emotional stress 07/15/2014  . Precordial pain 07/14/2014    Ahmed Prima, PTA 05/10/17 11:55 AM [place] Applegate MPT [facility] [address] [place], [place], [address] Phone: [phone]   Fax:  [phone]  Name: Steve Stone MRN: [id number] Date of Birth: 1961-01-14

## 2017-05-15 ENCOUNTER — Ambulatory Visit: Payer: BLUE CROSS/BLUE SHIELD | Admitting: Physical Therapy

## 2017-05-15 DIAGNOSIS — M25512 Pain in left shoulder: Secondary | ICD-10-CM | POA: Diagnosis not present

## 2017-05-15 DIAGNOSIS — M25612 Stiffness of left shoulder, not elsewhere classified: Secondary | ICD-10-CM

## 2017-05-15 NOTE — Therapy (Signed)
[facility] [place], [place], [address] Phone: [phone]   Fax:  [phone]  Physical Therapy Treatment  Patient Details  Name: Steve Stone MRN: [id number] Date of Birth: Apr 03, 1961 Referring Provider: [name] MD.  Encounter Date: 05/15/2017      PT End of Session - 05/15/17 1720    Visit Number 11   Number of Visits 24   Date for PT Re-Evaluation 06/18/17   PT Start Time 0445   PT Stop Time 0540   PT Time Calculation (min) 55 min   Activity Tolerance Patient tolerated treatment well   Behavior During Therapy [facility] for tasks assessed/performed      Past Medical History:  Diagnosis Date  . Esophageal reflux   . Hyperlipidemia     Past Surgical History:  Procedure Laterality Date  . APPENDECTOMY    . Arthroscopic knee surgery Right   . CHOLECYSTECTOMY    . HERNIA REPAIR      There were no vitals filed for this visit.      Subjective Assessment - 05/15/17 1720    Subjective Shoulder is doing better than when I first started.   Patient Stated Goals Use left arm again.   Pain Score 3    Pain Location Shoulder   Pain Orientation Left   Pain Descriptors / Indicators --  Stiff.   Pain Onset More than a month ago                         [facility] Adult PT Treatment/Exercise - 05/15/17 0001      Exercises   Exercises Shoulder     Shoulder Exercises: Pulleys   Flexion Limitations 5 minutes.   Other Pulley Exercises UE Ranger on wall x 5 minutes.     Shoulder Exercises: ROM/Strengthening   UBE (Upper Arm Bike) 120 RPM's (10minutes forward and 4 minutes backward) 8 minutes total.     Modalities   Modalities Electrical Stimulation;Moist Heat     Moist Heat Therapy   Number Minutes Moist Heat 20 Minutes   Moist Heat Location --  Left shoulder.     Acupuncturist Location Left shoulder.   Electrical Stimulation Action IFC   Electrical  Stimulation Parameters 80-150 Hz on 40% scan x 20 minutes.   Electrical Stimulation Goals Pain     Manual Therapy   Manual Therapy Passive ROM   Passive ROM PROM into left shoulder flexion and ER in the plane of the scapula with low load long duration stretching technique x 10 minutes.                     PT Long Term Goals - 04/11/17 1515      PT LONG TERM GOAL #1   Title Independent with a HEP.   Time 4   Period Weeks   Status On-going     PT LONG TERM GOAL #2   Title Active left shoulder flexion to 150 degrees so the patient can easily reach overhead   Time 4   Period Weeks   Status On-going     PT LONG TERM GOAL #3   Title Active ER to 70 degrees+ to allow for easily donning/doffing of apparel   Time 4   Period Weeks   Status On-going     PT LONG TERM GOAL #4   Title Increase ROM so patient is able to reach behind back to  L3.   Time 4   Period Weeks   Status On-going     PT LONG TERM GOAL #5   Title Increase left shoulder strength to a solid 4+/5 to increase stability for performance of functional activities   Time 4   Period Weeks   Status On-going     PT LONG TERM GOAL #6   Title Perform ADL's with pain not > 2-3/10.   Time 4   Period Weeks   Status On-going               Plan - 05/15/17 1727    Clinical Impression Statement Good improvement with regards to left shoulder ROM though he still exhibits significant muscle guarding.   Consulted and Agree with Plan of Care Patient      Patient will benefit from skilled therapeutic intervention in order to improve the following deficits and impairments:  Decreased activity tolerance, Decreased range of motion, Pain  Visit Diagnosis: Stiffness of left shoulder, not elsewhere classified - Plan: PT plan of care cert/re-cert  Acute pain of left shoulder - Plan: PT plan of care cert/re-cert     Problem List Patient Active Problem List   Diagnosis Date Noted  . Mixed hyperlipidemia  07/15/2014  . Emotional stress 07/15/2014  . Precordial pain 07/14/2014    Kevon Tench, [place] MPT 05/15/2017, 5:42 PM  [facility] [address] [place], [place], [address] Phone: [phone]   Fax:  [phone]  Name: Steve Stone MRN: [id number] Date of Birth: [birth year]

## 2017-05-17 ENCOUNTER — Ambulatory Visit: Payer: BLUE CROSS/BLUE SHIELD | Admitting: Physical Therapy

## 2017-05-17 ENCOUNTER — Encounter: Payer: Self-pay | Admitting: Physical Therapy

## 2017-05-17 DIAGNOSIS — G8929 Other chronic pain: Secondary | ICD-10-CM

## 2017-05-17 DIAGNOSIS — M25612 Stiffness of left shoulder, not elsewhere classified: Secondary | ICD-10-CM

## 2017-05-17 DIAGNOSIS — M25512 Pain in left shoulder: Secondary | ICD-10-CM

## 2017-05-17 NOTE — Therapy (Signed)
[facility] [place], [place], [address] Phone: [phone]   Fax:  [phone]  Physical Therapy Treatment  Patient Details  Name: Steve Stone MRN: [id number] Date of Birth: 1961-05-11 Referring Provider: [name] MD.  Encounter Date: 05/17/2017      PT End of Session - 05/17/17 1517    Visit Number 12   Number of Visits 24   Date for PT Re-Evaluation 06/18/17   PT Start Time 7253   PT Stop Time 1405   PT Time Calculation (min) 50 min   Activity Tolerance Patient tolerated treatment well   Behavior During Therapy [facility] for tasks assessed/performed      Past Medical History:  Diagnosis Date  . Esophageal reflux   . Hyperlipidemia     Past Surgical History:  Procedure Laterality Date  . APPENDECTOMY    . Arthroscopic knee surgery Right   . CHOLECYSTECTOMY    . HERNIA REPAIR      There were no vitals filed for this visit.      Subjective Assessment - 05/17/17 1516    Subjective Reports that his shoulder is doing a little better today.   Patient Stated Goals Use left arm again.   Currently in Pain? Yes   Pain Score --  No pain score provided by patient   Pain Location Shoulder   Pain Orientation Left   Pain Descriptors / Indicators Discomfort   Pain Type Surgical pain   Pain Onset More than a month ago            [facility] PT Assessment - 05/17/17 0001      Assessment   Medical Diagnosis Left open rotator cuff repair.   Onset Date/Surgical Date 03/14/17   Next MD Visit 06/02/2017     Restrictions   Weight Bearing Restrictions No                     [facility] Adult PT Treatment/Exercise - 05/17/17 0001      Shoulder Exercises: Supine   Protraction AAROM;Both;Other (comment)  x30 reps   External Rotation AAROM;Left  3x10 reps   Flexion AAROM;Both  x30 reps     Shoulder Exercises: Pulleys   Flexion Other (comment)  x5 min   Other Pulley Exercises UE Ranger on wall x 5  minutes.   Other Pulley Exercises Wall ladder (32-34) x10 reps     Modalities   Modalities Electrical Stimulation;Moist Heat     Moist Heat Therapy   Number Minutes Moist Heat 5 Minutes   Moist Heat Location Shoulder     Electrical Stimulation   Electrical Stimulation Location Left shoulder.   Electrical Stimulation Action IFC   Electrical Stimulation Parameters 1-10 hz x15 min   Electrical Stimulation Goals Pain     Manual Therapy   Manual Therapy Passive ROM   Passive ROM PROM of R shoulder into flex/ER with holds at end range                     PT Long Term Goals - 04/11/17 1515      PT LONG TERM GOAL #1   Title Independent with a HEP.   Time 4   Period Weeks   Status On-going     PT LONG TERM GOAL #2   Title Active left shoulder flexion to 150 degrees so the patient can easily reach overhead   Time 4   Period Weeks   Status On-going  PT LONG TERM GOAL #3   Title Active ER to 70 degrees+ to allow for easily donning/doffing of apparel   Time 4   Period Weeks   Status On-going     PT LONG TERM GOAL #4   Title Increase ROM so patient is able to reach behind back to L3.   Time 4   Period Weeks   Status On-going     PT LONG TERM GOAL #5   Title Increase left shoulder strength to a solid 4+/5 to increase stability for performance of functional activities   Time 4   Period Weeks   Status On-going     PT LONG TERM GOAL #6   Title Perform ADL's with pain not > 2-3/10.   Time 4   Period Weeks   Status On-going               Plan - 05/17/17 1555    Clinical Impression Statement Patient tolerated today's treatment well as ROM has improved with exercises. Patient able to complete exercises without reports of pain. Firm end feel noted with all directions of PROM of R shoulder today. Smooth arc of motion also noted with all directions of PROM with more limitation in ER than in flexion. Normal modalities response noted following removal of the  modalities.   Rehab Potential Good   Clinical Impairments Affecting Rehab Potential surgery 03/14/17 and current 6 weeks 04/25/17   PT Frequency 3x / week   PT Duration 4 weeks   PT Treatment/Interventions ADLs/Self Care Home Management;Cryotherapy;Electrical Stimulation;Moist Heat;Ultrasound;Therapeutic exercise;Therapeutic activities;Patient/family education;Manual techniques;Passive range of motion;Vasopneumatic Device   PT Next Visit Plan Continue with AAROM exercises per protocol and modalities for pain relief per MPT POC.   Consulted and Agree with Plan of Care Patient      Patient will benefit from skilled therapeutic intervention in order to improve the following deficits and impairments:  Decreased activity tolerance, Decreased range of motion, Pain  Visit Diagnosis: Stiffness of left shoulder, not elsewhere classified  Acute pain of left shoulder  Chronic left shoulder pain     Problem List Patient Active Problem List   Diagnosis Date Noted  . Mixed hyperlipidemia 07/15/2014  . Emotional stress 07/15/2014  . Precordial pain 07/14/2014    Wynelle Fanny, PTA 05/17/2017, 4:17 PM  [facility] [address] [place], [place], [address] Phone: [phone]   Fax:  [phone]  Name: Steve Stone MRN: [id number] Date of Birth: 1960-11-20

## 2017-05-22 ENCOUNTER — Ambulatory Visit: Payer: BLUE CROSS/BLUE SHIELD | Admitting: *Deleted

## 2017-05-22 DIAGNOSIS — G8929 Other chronic pain: Secondary | ICD-10-CM

## 2017-05-22 DIAGNOSIS — M25612 Stiffness of left shoulder, not elsewhere classified: Secondary | ICD-10-CM

## 2017-05-22 DIAGNOSIS — M25512 Pain in left shoulder: Secondary | ICD-10-CM | POA: Diagnosis not present

## 2017-05-22 NOTE — Therapy (Signed)
[facility] [place], [place], [address] Phone: [phone]   Fax:  [phone]  Physical Therapy Treatment  Patient Details  Name: Steve Stone MRN: [id number] Date of Birth: 11-30-1960 Referring Provider: [name] MD.  Encounter Date: 05/22/2017      PT End of Session - 05/22/17 1623    Visit Number 13   Number of Visits 24   Date for PT Re-Evaluation 06/18/17   PT Start Time 8416   PT Stop Time 1605   PT Time Calculation (min) 50 min      Past Medical History:  Diagnosis Date  . Esophageal reflux   . Hyperlipidemia     Past Surgical History:  Procedure Laterality Date  . APPENDECTOMY    . Arthroscopic knee surgery Right   . CHOLECYSTECTOMY    . HERNIA REPAIR      There were no vitals filed for this visit.                       [facility] Adult PT Treatment/Exercise - 05/22/17 0001      Exercises   Exercises Shoulder     Shoulder Exercises: Pulleys   Flexion Other (comment)  x5 min   Other Pulley Exercises UE Ranger on wall x 5 minutes.     Modalities   Modalities Electrical Stimulation;Moist Heat     Moist Heat Therapy   Number Minutes Moist Heat 15 Minutes     Electrical Stimulation   Electrical Stimulation Location Left shoulder. IFC 1-10hz  x 15 mins   Electrical Stimulation Goals Pain     Manual Therapy   Manual Therapy Passive ROM   Passive ROM PROM of R shoulder into flex/ER /IR ABD  with holds at end range. Contract/relax used to facilitate ROM for flexion and ER, Rhythmic stab for IR/ER                     PT Long Term Goals - 04/11/17 1515      PT LONG TERM GOAL #1   Title Independent with a HEP.   Time 4   Period Weeks   Status On-going     PT LONG TERM GOAL #2   Title Active left shoulder flexion to 150 degrees so the patient can easily reach overhead   Time 4   Period Weeks   Status On-going     PT LONG TERM GOAL #3   Title Active ER  to 70 degrees+ to allow for easily donning/doffing of apparel   Time 4   Period Weeks   Status On-going     PT LONG TERM GOAL #4   Title Increase ROM so patient is able to reach behind back to L3.   Time 4   Period Weeks   Status On-going     PT LONG TERM GOAL #5   Title Increase left shoulder strength to a solid 4+/5 to increase stability for performance of functional activities   Time 4   Period Weeks   Status On-going     PT LONG TERM GOAL #6   Title Perform ADL's with pain not > 2-3/10.   Time 4   Period Weeks   Status On-going               Plan - 05/22/17 1526    Clinical Impression Statement Pt is progressing fairly well and feels that today is the best his shldr has felt. He was  also able to ride his [employer] this weekend as well. Rx focused on ROM for all motions. Pt did well with contract / relax technique for flexion and ER. normal  response to modalities   Clinical Impairments Affecting Rehab Potential surgery 03/14/17 and current 10 weeks 05/23/17   PT Duration 4 weeks   PT Treatment/Interventions ADLs/Self Care Home Management;Cryotherapy;Electrical Stimulation;Moist Heat;Ultrasound;Therapeutic exercise;Therapeutic activities;Patient/family education;Manual techniques;Passive range of motion;Vasopneumatic Device   PT Next Visit Plan Continue with AAROM exercises per protocol and modalities for pain relief per MPT POC.   Consulted and Agree with Plan of Care Patient      Patient will benefit from skilled therapeutic intervention in order to improve the following deficits and impairments:     Visit Diagnosis: Stiffness of left shoulder, not elsewhere classified  Acute pain of left shoulder  Chronic left shoulder pain     Problem List Patient Active Problem List   Diagnosis Date Noted  . Mixed hyperlipidemia 07/15/2014  . Emotional stress 07/15/2014  . Precordial pain 07/14/2014    Margueritte Guthridge,CHRIS, PTA 05/22/2017, 4:24 PM  [facility] [place], [place], [address] Phone: [phone]   Fax:  [phone]  Name: Steve Stone MRN: [id number] Date of Birth: [birth year]

## 2017-05-24 ENCOUNTER — Ambulatory Visit: Payer: BLUE CROSS/BLUE SHIELD | Admitting: *Deleted

## 2017-05-24 DIAGNOSIS — G8929 Other chronic pain: Secondary | ICD-10-CM

## 2017-05-24 DIAGNOSIS — M25512 Pain in left shoulder: Secondary | ICD-10-CM

## 2017-05-24 DIAGNOSIS — M25612 Stiffness of left shoulder, not elsewhere classified: Secondary | ICD-10-CM

## 2017-05-24 NOTE — Therapy (Signed)
[facility] [place], [place], [address] Phone: [phone]   Fax:  [phone]  Physical Therapy Treatment  Patient Details  Name: Steve Stone MRN: [id number] Date of Birth: 1961/08/29 Referring Provider: [name] MD.  Encounter Date: 05/24/2017      PT End of Session - 05/24/17 1726    Visit Number 14   Number of Visits 24   Date for PT Re-Evaluation 06/18/17   PT Start Time 3664   PT Stop Time 1605   PT Time Calculation (min) 50 min      Past Medical History:  Diagnosis Date  . Esophageal reflux   . Hyperlipidemia     Past Surgical History:  Procedure Laterality Date  . APPENDECTOMY    . Arthroscopic knee surgery Right   . CHOLECYSTECTOMY    . HERNIA REPAIR      There were no vitals filed for this visit.                       [facility] Adult PT Treatment/Exercise - 05/24/17 0001      Exercises   Exercises Shoulder     Shoulder Exercises: Pulleys   Flexion Other (comment)  x5 min   Other Pulley Exercises UE Ranger on wall x 5 minutes.     Modalities   Modalities Electrical Stimulation;Moist Heat     Moist Heat Therapy   Number Minutes Moist Heat 15 Minutes   Moist Heat Location Shoulder     Electrical Stimulation   Electrical Stimulation Location Left shoulder. IFC 1-10hz  x 15 mins   Electrical Stimulation Goals Pain     Manual Therapy   Manual Therapy Passive ROM   Passive ROM PROM of R shoulder into flex/ER /IR ABD  with holds at end range. Contract/relax used to facilitate ROM for flexion and ER, Rhythmic stab for IR/ER                     PT Long Term Goals - 04/11/17 1515      PT LONG TERM GOAL #1   Title Independent with a HEP.   Time 4   Period Weeks   Status On-going     PT LONG TERM GOAL #2   Title Active left shoulder flexion to 150 degrees so the patient can easily reach overhead   Time 4   Period Weeks   Status On-going     PT LONG  TERM GOAL #3   Title Active ER to 70 degrees+ to allow for easily donning/doffing of apparel   Time 4   Period Weeks   Status On-going     PT LONG TERM GOAL #4   Title Increase ROM so patient is able to reach behind back to L3.   Time 4   Period Weeks   Status On-going     PT LONG TERM GOAL #5   Title Increase left shoulder strength to a solid 4+/5 to increase stability for performance of functional activities   Time 4   Period Weeks   Status On-going     PT LONG TERM GOAL #6   Title Perform ADL's with pain not > 2-3/10.   Time 4   Period Weeks   Status On-going               Plan - 05/24/17 1527    Clinical Impression Statement Pt arrived to clinic with mainly soreness in LT shldr. He was  able to complete AAROM exs with mainly discomfort. His PROM has increased to 135 degrees in flexion, ER 60 degrees, and IR to 65 degrees. Normal response to modalities.   Clinical Presentation Stable   Clinical Decision Making Low   Rehab Potential Good   Clinical Impairments Affecting Rehab Potential surgery 03/14/17 and current 10 weeks 05/23/17   PT Frequency 3x / week   PT Duration 4 weeks   PT Treatment/Interventions ADLs/Self Care Home Management;Cryotherapy;Electrical Stimulation;Moist Heat;Ultrasound;Therapeutic exercise;Therapeutic activities;Patient/family education;Manual techniques;Passive range of motion;Vasopneumatic Device   PT Next Visit Plan Continue with AAROM exercises per protocol and modalities for pain relief per MPT POC.   Consulted and Agree with Plan of Care Patient      Patient will benefit from skilled therapeutic intervention in order to improve the following deficits and impairments:  Decreased activity tolerance, Decreased range of motion, Pain  Visit Diagnosis: Stiffness of left shoulder, not elsewhere classified  Acute pain of left shoulder  Chronic left shoulder pain     Problem List Patient Active Problem List   Diagnosis Date Noted  .  Mixed hyperlipidemia 07/15/2014  . Emotional stress 07/15/2014  . Precordial pain 07/14/2014    Biff Rutigliano,CHRIS, PTA 05/24/2017, 5:28 PM  [facility] [place], [place], [address] Phone: [phone]   Fax:  [phone]  Name: Steve Stone MRN: [id number] Date of Birth: 05-Sep-1961

## 2017-05-29 ENCOUNTER — Ambulatory Visit: Payer: BLUE CROSS/BLUE SHIELD | Admitting: *Deleted

## 2017-05-29 DIAGNOSIS — M25512 Pain in left shoulder: Secondary | ICD-10-CM

## 2017-05-29 DIAGNOSIS — M25612 Stiffness of left shoulder, not elsewhere classified: Secondary | ICD-10-CM

## 2017-05-29 DIAGNOSIS — G8929 Other chronic pain: Secondary | ICD-10-CM

## 2017-05-29 NOTE — Therapy (Signed)
[facility] [place], [place], [address] Phone: [phone]   Fax:  [phone]  Physical Therapy Treatment  Patient Details  Name: Steve Stone MRN: [id number] Date of Birth: 26-Oct-1961 Referring Provider: [name] MD.  Encounter Date: 05/29/2017      PT End of Session - 05/29/17 1608    Visit Number 15   Number of Visits 24   Date for PT Re-Evaluation 06/18/17   PT Start Time 1600   PT Stop Time 1650   PT Time Calculation (min) 50 min      Past Medical History:  Diagnosis Date  . Esophageal reflux   . Hyperlipidemia     Past Surgical History:  Procedure Laterality Date  . APPENDECTOMY    . Arthroscopic knee surgery Right   . CHOLECYSTECTOMY    . HERNIA REPAIR      There were no vitals filed for this visit.                       [facility] Adult PT Treatment/Exercise - 05/29/17 0001      Exercises   Exercises Shoulder     Shoulder Exercises: Pulleys   Flexion Other (comment)  x5 min   Other Pulley Exercises UE Ranger on wall x 5 minutes.     Modalities   Modalities Electrical Stimulation;Moist Heat     Moist Heat Therapy   Number Minutes Moist Heat 15 Minutes   Moist Heat Location Shoulder     Electrical Stimulation   Electrical Stimulation Location Left shoulder. IFC 1-10hz  x 15 mins   Electrical Stimulation Goals Pain     Manual Therapy   Manual Therapy Passive ROM   Passive ROM PROM of R shoulder into flex/ER /IR ABD  with holds at end range. Contract/relax used to facilitate ROM for flexion and ER, Rhythmic stab for IR/ER and elevation at 90 degrees and above.                     PT Long Term Goals - 04/11/17 1515      PT LONG TERM GOAL #1   Title Independent with a HEP.   Time 4   Period Weeks   Status On-going     PT LONG TERM GOAL #2   Title Active left shoulder flexion to 150 degrees so the patient can easily reach overhead   Time 4   Period  Weeks   Status On-going     PT LONG TERM GOAL #3   Title Active ER to 70 degrees+ to allow for easily donning/doffing of apparel   Time 4   Period Weeks   Status On-going     PT LONG TERM GOAL #4   Title Increase ROM so patient is able to reach behind back to L3.   Time 4   Period Weeks   Status On-going     PT LONG TERM GOAL #5   Title Increase left shoulder strength to a solid 4+/5 to increase stability for performance of functional activities   Time 4   Period Weeks   Status On-going     PT LONG TERM GOAL #6   Title Perform ADL's with pain not > 2-3/10.   Time 4   Period Weeks   Status On-going               Plan - 05/29/17 1612    Clinical Impression Statement Pt arrived today doing fairly  well with minimal pain in Lt shldr. He was able to perform all AAROM exs with mainly fatigue. PROM  for flexion today was 150 degrees, ER 60 , and IR to 80 degrees.. Normal response to modalities   PT Frequency 3x / week   PT Duration 4 weeks   PT Treatment/Interventions ADLs/Self Care Home Management;Cryotherapy;Electrical Stimulation;Moist Heat;Ultrasound;Therapeutic exercise;Therapeutic activities;Patient/family education;Manual techniques;Passive range of motion;Vasopneumatic Device   PT Next Visit Plan Continue with AAROM exercises per protocol and modalities for pain relief per MPT POC.   Consulted and Agree with Plan of Care Patient      Patient will benefit from skilled therapeutic intervention in order to improve the following deficits and impairments:  Decreased activity tolerance, Decreased range of motion, Pain  Visit Diagnosis: Stiffness of left shoulder, not elsewhere classified  Acute pain of left shoulder  Chronic left shoulder pain     Problem List Patient Active Problem List   Diagnosis Date Noted  . Mixed hyperlipidemia 07/15/2014  . Emotional stress 07/15/2014  . Precordial pain 07/14/2014    Shron Ozer,CHRIS, PTA 05/29/2017, 5:28 PM  [facility] [place], [place], [address] Phone: [phone]   Fax:  [phone]  Name: Steve Stone MRN: [id number] Date of Birth: May 29, 1961

## 2017-05-31 ENCOUNTER — Ambulatory Visit: Payer: BLUE CROSS/BLUE SHIELD | Admitting: *Deleted

## 2017-05-31 DIAGNOSIS — M25512 Pain in left shoulder: Secondary | ICD-10-CM

## 2017-05-31 DIAGNOSIS — G8929 Other chronic pain: Secondary | ICD-10-CM

## 2017-05-31 DIAGNOSIS — M25612 Stiffness of left shoulder, not elsewhere classified: Secondary | ICD-10-CM

## 2017-05-31 NOTE — Therapy (Signed)
[facility] [place], [place], [address] Phone: [phone]   Fax:  [phone]  Physical Therapy Treatment  Patient Details  Name: Steve Stone MRN: [id number] Date of Birth: 16-Apr-1961 Referring Provider: [name] MD.  Encounter Date: 05/31/2017      PT End of Session - 05/31/17 1523    Visit Number 16   Number of Visits 24   Date for PT Re-Evaluation 06/18/17   PT Start Time 1779   PT Stop Time 1607   PT Time Calculation (min) 52 min      Past Medical History:  Diagnosis Date  . Esophageal reflux   . Hyperlipidemia     Past Surgical History:  Procedure Laterality Date  . APPENDECTOMY    . Arthroscopic knee surgery Right   . CHOLECYSTECTOMY    . HERNIA REPAIR      There were no vitals filed for this visit.      Subjective Assessment - 05/31/17 1521    Subjective Reports that his shoulder is doing a little better today.   Patient Stated Goals Use left arm again.   Currently in Pain? No/denies   Pain Location Shoulder   Pain Orientation Left   Pain Descriptors / Indicators Sore                         [facility] Adult PT Treatment/Exercise - 05/31/17 0001      Exercises   Exercises Shoulder     Shoulder Exercises: Supine   Flexion AROM;Left;10 reps     Shoulder Exercises: Prone   Flexion AROM;Left;20 reps   Extension AROM;Left;20 reps   Horizontal ABduction 1 AROM;Left;20 reps     Shoulder Exercises: Pulleys   Flexion Other (comment)  x5 min   Other Pulley Exercises UE Ranger on wall x 5 minutes.     Modalities   Modalities Electrical Stimulation;Moist Heat     Moist Heat Therapy   Number Minutes Moist Heat 15 Minutes   Moist Heat Location Shoulder     Electrical Stimulation   Electrical Stimulation Location Left shoulder. IFC 1-10hz  x 15 mins   Electrical Stimulation Goals Pain     Manual Therapy   Manual Therapy Passive ROM   Passive ROM PROM of R shoulder  into flex 162 /ER 65  /IR 80  ABD 160  with holds at end range. Contract/relax used to facilitate ROM for flexion and ER, Rhythmic stab for IR/ER and elevation at 90 degrees and above.                     PT Long Term Goals - 04/11/17 1515      PT LONG TERM GOAL #1   Title Independent with a HEP.   Time 4   Period Weeks   Status On-going     PT LONG TERM GOAL #2   Title Active left shoulder flexion to 150 degrees so the patient can easily reach overhead   Time 4   Period Weeks   Status On-going     PT LONG TERM GOAL #3   Title Active ER to 70 degrees+ to allow for easily donning/doffing of apparel   Time 4   Period Weeks   Status On-going     PT LONG TERM GOAL #4   Title Increase ROM so patient is able to reach behind back to L3.   Time 4   Period Weeks   Status  On-going     PT LONG TERM GOAL #5   Title Increase left shoulder strength to a solid 4+/5 to increase stability for performance of functional activities   Time 4   Period Weeks   Status On-going     PT LONG TERM GOAL #6   Title Perform ADL's with pain not > 2-3/10.   Time 4   Period Weeks   Status On-going               Plan - 05/31/17 1607    Clinical Impression Statement Pt did fairly well with AAROM/ AROM therex today for LT shldr.. His PROM was flexion to 162 degrees, ER 65, Abd to 160 degrees and HBB for IR to L 4-5   Clinical Presentation Stable   Rehab Potential Good   Clinical Impairments Affecting Rehab Potential surgery 03/14/17 and current 10 weeks 05/23/17   PT Frequency 3x / week   PT Duration 4 weeks   PT Treatment/Interventions ADLs/Self Care Home Management;Cryotherapy;Electrical Stimulation;Moist Heat;Ultrasound;Therapeutic exercise;Therapeutic activities;Patient/family education;Manual techniques;Passive range of motion;Vasopneumatic Device   PT Next Visit Plan Continue with AAROM exercises per protocol and modalities for pain relief per MPT POC.   Send MD note today    Consulted and Agree with Plan of Care Patient      Patient will benefit from skilled therapeutic intervention in order to improve the following deficits and impairments:  Decreased activity tolerance, Decreased range of motion, Pain  Visit Diagnosis: Acute pain of left shoulder  Stiffness of left shoulder, not elsewhere classified  Chronic left shoulder pain     Problem List Patient Active Problem List   Diagnosis Date Noted  . Mixed hyperlipidemia 07/15/2014  . Emotional stress 07/15/2014  . Precordial pain 07/14/2014    Vernis Cabacungan,CHRIS , PTA 05/31/2017, 4:15 PM [place] MPT [facility] [address] [place], [place], [address] Phone: [phone]   Fax:  [phone]  Name: Steve Stone MRN: [id number] Date of Birth: 15-Jan-1961

## 2017-06-05 ENCOUNTER — Ambulatory Visit: Payer: BLUE CROSS/BLUE SHIELD | Admitting: Physical Therapy

## 2017-06-05 ENCOUNTER — Encounter: Payer: Self-pay | Admitting: Physical Therapy

## 2017-06-05 DIAGNOSIS — M25512 Pain in left shoulder: Secondary | ICD-10-CM

## 2017-06-05 DIAGNOSIS — M25612 Stiffness of left shoulder, not elsewhere classified: Secondary | ICD-10-CM

## 2017-06-05 NOTE — Therapy (Signed)
[facility] [place], [place], [address] Phone: [phone]   Fax:  [phone]  Physical Therapy Treatment  Patient Details  Name: Steve Stone MRN: [id number] Date of Birth: 1961/06/30 Referring Provider: [name] MD.  Encounter Date: 06/05/2017      PT End of Session - 06/05/17 1519    Visit Number 17   Number of Visits 24   Date for PT Re-Evaluation 06/18/17   PT Start Time 4163   PT Stop Time 1555   PT Time Calculation (min) 39 min   Activity Tolerance Patient tolerated treatment well   Behavior During Therapy [facility] for tasks assessed/performed      Past Medical History:  Diagnosis Date  . Esophageal reflux   . Hyperlipidemia     Past Surgical History:  Procedure Laterality Date  . APPENDECTOMY    . Arthroscopic knee surgery Right   . CHOLECYSTECTOMY    . HERNIA REPAIR      There were no vitals filed for this visit.      Subjective Assessment - 06/05/17 1518    Subjective Reports that MD said he could be done with therapy after today. Reports maybe some intermittant pain or at work if he tries to lift something he shouldn't and has stiffness intiially in the morning.   Patient Stated Goals Use left arm again.   Currently in Pain? No/denies                                 PT Education - 06/05/17 1557    Education provided Yes   Education Details HEP- RW4 and SL ER with yellow and red theraband   Person(s) Educated Patient   Methods Explanation;Verbal cues;Handout   Comprehension Verbalized understanding;Verbal cues required             PT Long Term Goals - 06/05/17 1519      PT LONG TERM GOAL #1   Title Independent with a HEP.   Time 4   Period Weeks   Status Achieved     PT LONG TERM GOAL #2   Title Active left shoulder flexion to 150 degrees so the patient can easily reach overhead   Time 4   Period Weeks   Status Achieved     PT LONG TERM GOAL  #3   Title Active ER to 70 degrees+ to allow for easily donning/doffing of apparel   Time 4   Period Weeks   Status Achieved     PT LONG TERM GOAL #4   Title Increase ROM so patient is able to reach behind back to L3.   Time 4   Period Weeks   Status Achieved     PT LONG TERM GOAL #5   Title Increase left shoulder strength to a solid 4+/5 to increase stability for performance of functional activities   Time 4   Period Weeks   Status Achieved     PT LONG TERM GOAL #6   Title Perform ADL's with pain not > 2-3/10.   Time 4   Period Weeks   Status Achieved               Plan - 06/05/17 1602    Clinical Impression Statement Patient tolerated today's treatment well and has progressed well through PT following L shoulder RCR on 03/14/2017. Patient denied any pain during treatment and minimal difficulty with resisted exercises. Minimal  to moderate multimodal cueing required for proper exercise technique and corrections during today's treatment. Patient able to achieve all goals set at evaluation at this time with MMT and ROM measurements provided in today's note. Firm end feel and smooth arc of motion with PROM of L shoulder into ER. Patient denied any modalities following today's treatment. Patient educated regarding new strengthening HEP per MD discharge with parameters and technique instructions and patient verbalizing understanding.   Rehab Potential Good   Clinical Impairments Affecting Rehab Potential surgery 03/14/17 and current 10 weeks 05/23/17   PT Frequency 3x / week   PT Duration 4 weeks   PT Treatment/Interventions ADLs/Self Care Home Management;Cryotherapy;Electrical Stimulation;Moist Heat;Ultrasound;Therapeutic exercise;Therapeutic activities;Patient/family education;Manual techniques;Passive range of motion;Vasopneumatic Device   PT Next Visit Plan D/C from PT per MD discretion.   Consulted and Agree with Plan of Care Patient      Patient will benefit from skilled  therapeutic intervention in order to improve the following deficits and impairments:  Decreased activity tolerance, Decreased range of motion, Pain  Visit Diagnosis: Acute pain of left shoulder  Stiffness of left shoulder, not elsewhere classified     Problem List Patient Active Problem List   Diagnosis Date Noted  . Mixed hyperlipidemia 07/15/2014  . Emotional stress 07/15/2014  . Precordial pain 07/14/2014   Ahmed Prima, PTA 06/06/17 1:38 PM  [facility] [place], [place], [address] Phone: [phone]   Fax:  [phone]  Name: Steve Stone MRN: [id number] Date of Birth: 02-25-1961 PHYSICAL THERAPY DISCHARGE SUMMARY  Visits from Start of Care: 17.  Current functional level related to goals / functional outcomes: See above.   Remaining deficits: All goals met.   Education / Equipment: HEP. Plan: Patient agrees to discharge.  Patient goals were met. Patient is being discharged due to meeting the stated rehab goals.  ?????         Mali Applegate MPT

## 2017-06-05 NOTE — Patient Instructions (Addendum)
Scapular Retraction: Bilateral    Facing anchor, pull arms back, bringing shoulder blades together. Repeat __10__ times per set. Do __2-3__ sets per session. Do _2-3___ sessions per day.  http://orth.exer.us/176   Copyright  VHI. All rights reserved.  Strengthening: Resisted External Rotation    Hold tubing in left hand, elbow at side and forearm across body. Rotate forearm out. Repeat _10___ times per set. Do __2-3__ sets per session. Do _2-3___ sessions per day.  http://orth.exer.us/828   Copyright  VHI. All rights reserved.  Strengthening: Resisted Internal Rotation    Hold tubing in left hand, elbow at side and forearm out. Rotate forearm in across body. Repeat _10___ times per set. Do _2-3___ sets per session. Do _2-3___ sessions per day.  http://orth.exer.us/830   Copyright  VHI. All rights reserved.  Strengthening: Resisted Extension    Hold tubing in left hand, arm forward. Pull arm back, elbow straight. Repeat __10__ times per set. Do __2-3__ sets per session. Do __2-3__ sessions per day.  http://orth.exer.us/832   Copyright  VHI. All rights reserved.  Progressive Resisted: External Rotation (Side-Lying)    Holding __1-2__ pound weight, towel under arm, raise left forearm toward ceiling. Keep elbow bent and at side. Repeat __10__ times per set. Do _2-3___ sets per session. Do __2-3__ sessions per day.  http://orth.exer.us/878   Copyright  VHI. All rights reserved.

## 2017-06-07 ENCOUNTER — Encounter: Payer: BLUE CROSS/BLUE SHIELD | Admitting: Physical Therapy

## 2017-12-19 ENCOUNTER — Observation Stay (HOSPITAL_COMMUNITY)
Admission: EM | Admit: 2017-12-19 | Discharge: 2017-12-20 | Disposition: A | Discharge status: Home / Self Care | Payer: BLUE CROSS/BLUE SHIELD | Attending: Internal Medicine | Admitting: Internal Medicine

## 2017-12-19 ENCOUNTER — Encounter (HOSPITAL_COMMUNITY): Payer: Self-pay | Admitting: Emergency Medicine

## 2017-12-19 ENCOUNTER — Emergency Department (HOSPITAL_COMMUNITY): Payer: BLUE CROSS/BLUE SHIELD

## 2017-12-19 ENCOUNTER — Observation Stay (HOSPITAL_COMMUNITY): Payer: BLUE CROSS/BLUE SHIELD

## 2017-12-19 ENCOUNTER — Other Ambulatory Visit: Payer: Self-pay

## 2017-12-19 DIAGNOSIS — E785 Hyperlipidemia, unspecified: Secondary | ICD-10-CM | POA: Insufficient documentation

## 2017-12-19 DIAGNOSIS — F419 Anxiety disorder, unspecified: Secondary | ICD-10-CM | POA: Insufficient documentation

## 2017-12-19 DIAGNOSIS — R600 Localized edema: Secondary | ICD-10-CM | POA: Insufficient documentation

## 2017-12-19 DIAGNOSIS — R42 Dizziness and giddiness: Secondary | ICD-10-CM | POA: Insufficient documentation

## 2017-12-19 DIAGNOSIS — R0789 Other chest pain: Principal | ICD-10-CM | POA: Insufficient documentation

## 2017-12-19 DIAGNOSIS — R410 Disorientation, unspecified: Secondary | ICD-10-CM

## 2017-12-19 DIAGNOSIS — R519 Headache, unspecified: Secondary | ICD-10-CM

## 2017-12-19 DIAGNOSIS — F17290 Nicotine dependence, other tobacco product, uncomplicated: Secondary | ICD-10-CM | POA: Diagnosis not present

## 2017-12-19 DIAGNOSIS — H539 Unspecified visual disturbance: Secondary | ICD-10-CM

## 2017-12-19 DIAGNOSIS — H538 Other visual disturbances: Secondary | ICD-10-CM | POA: Diagnosis not present

## 2017-12-19 DIAGNOSIS — Z79899 Other long term (current) drug therapy: Secondary | ICD-10-CM | POA: Diagnosis not present

## 2017-12-19 DIAGNOSIS — R030 Elevated blood-pressure reading, without diagnosis of hypertension: Secondary | ICD-10-CM | POA: Diagnosis not present

## 2017-12-19 DIAGNOSIS — R079 Chest pain, unspecified: Secondary | ICD-10-CM | POA: Diagnosis not present

## 2017-12-19 DIAGNOSIS — E782 Mixed hyperlipidemia: Secondary | ICD-10-CM

## 2017-12-19 DIAGNOSIS — R51 Headache: Secondary | ICD-10-CM

## 2017-12-19 DIAGNOSIS — R072 Precordial pain: Secondary | ICD-10-CM | POA: Diagnosis not present

## 2017-12-19 DIAGNOSIS — F1020 Alcohol dependence, uncomplicated: Secondary | ICD-10-CM | POA: Insufficient documentation

## 2017-12-19 LAB — BASIC METABOLIC PANEL
Anion gap: 9 (ref 5–15)
BUN: 13 mg/dL (ref 6–20)
CO2: 25 mmol/L (ref 22–32)
Calcium: 9.2 mg/dL (ref 8.9–10.3)
Chloride: 105 mmol/L (ref 101–111)
Creatinine, Ser: 0.95 mg/dL (ref 0.61–1.24)
GFR calc Af Amer: >60 mL/min (ref >60)
GFR calc non Af Amer: >60 mL/min (ref >60)
Glucose, Bld: 109 mg/dL — ABNORMAL HIGH (ref 65–99)
POTASSIUM: 3.7 mmol/L (ref 3.5–5.1)
Sodium: 139 mmol/L (ref 135–145)

## 2017-12-19 LAB — CBC WITH DIFFERENTIAL/PLATELET
Basophils Absolute: 0 10*3/uL (ref 0.0–0.1)
Basophils Relative: 0 %
Eosinophils Absolute: 0 10*3/uL (ref 0.0–0.7)
Eosinophils Relative: 1 %
HCT: 44.1 % (ref 39.0–52.0)
HEMOGLOBIN: 14.7 g/dL (ref 13.0–17.0)
Lymphocytes Relative: 28 %
Lymphs Abs: 1.6 10*3/uL (ref 0.7–4.0)
MCH: 29.8 pg (ref 26.0–34.0)
MCHC: 33.3 g/dL (ref 30.0–36.0)
MCV: 89.3 fL (ref 78.0–100.0)
Monocytes Absolute: 0.5 10*3/uL (ref 0.1–1.0)
Monocytes Relative: 8 %
NEUTROS PCT: 63 %
Neutro Abs: 3.6 10*3/uL (ref 1.7–7.7)
Platelets: 183 10*3/uL (ref 150–400)
RBC: 4.94 MIL/uL (ref 4.22–5.81)
RDW: 13 % (ref 11.5–15.5)
WBC: 5.7 10*3/uL (ref 4.0–10.5)

## 2017-12-19 LAB — I-STAT TROPONIN, ED: Troponin i, poc: 0 ng/mL (ref 0.00–0.08)

## 2017-12-19 LAB — TROPONIN I

## 2017-12-19 LAB — I-STAT CHEM 8, ED
BUN: 12 mg/dL (ref 6–20)
CALCIUM ION: 1.14 mmol/L — AB (ref 1.15–1.40)
Chloride: 104 mmol/L (ref 101–111)
Creatinine, Ser: 0.9 mg/dL (ref 0.61–1.24)
GLUCOSE: 104 mg/dL — AB (ref 65–99)
HCT: 46 % (ref 39.0–52.0)
Hemoglobin: 15.6 g/dL (ref 13.0–17.0)
Potassium: 3.8 mmol/L (ref 3.5–5.1)
Sodium: 141 mmol/L (ref 135–145)
TCO2: 25 mmol/L (ref 22–32)

## 2017-12-19 LAB — APTT: aPTT: 29 seconds (ref 24–36)

## 2017-12-19 LAB — PROTIME-INR
INR: 0.95
Prothrombin Time: 12.6 seconds (ref 11.4–15.2)

## 2017-12-19 LAB — HEMOGLOBIN A1C
Hgb A1c MFr Bld: 5.4 % (ref 4.8–5.6)
MEAN PLASMA GLUCOSE: 108.28 mg/dL

## 2017-12-19 MED ORDER — ADULT MULTIVITAMIN W/MINERALS CH
1.0000 | ORAL_TABLET | Freq: Every day | ORAL | Status: DC
  Administered 2017-12-19 – 2017-12-20 (×2): 1 via ORAL
  Filled 2017-12-19 (×2): qty 1

## 2017-12-19 MED ORDER — ACETAMINOPHEN 325 MG PO TABS: 650.0000 mg | ORAL_TABLET | Freq: Four times a day (QID) | ORAL | Status: DC | PRN

## 2017-12-19 MED ORDER — LORAZEPAM 2 MG/ML IJ SOLN: 1.0000 mg | Freq: Four times a day (QID) | INTRAMUSCULAR | Status: DC | PRN

## 2017-12-19 MED ORDER — FOLIC ACID 1 MG PO TABS
1.0000 mg | ORAL_TABLET | Freq: Every day | ORAL | Status: DC
  Administered 2017-12-19 – 2017-12-20 (×2): 1 mg via ORAL
  Filled 2017-12-19 (×2): qty 1

## 2017-12-19 MED ORDER — ONDANSETRON HCL 4 MG PO TABS: 4.0000 mg | ORAL_TABLET | Freq: Four times a day (QID) | ORAL | Status: DC | PRN

## 2017-12-19 MED ORDER — VITAMIN B-1 100 MG PO TABS
100.0000 mg | ORAL_TABLET | Freq: Every day | ORAL | Status: DC
  Administered 2017-12-19 – 2017-12-20 (×2): 100 mg via ORAL
  Filled 2017-12-19 (×2): qty 1

## 2017-12-19 MED ORDER — ONDANSETRON HCL 4 MG/2ML IJ SOLN: 4.0000 mg | Freq: Four times a day (QID) | INTRAMUSCULAR | Status: DC | PRN

## 2017-12-19 MED ORDER — ASPIRIN 81 MG PO CHEW
324.0000 mg | CHEWABLE_TABLET | Freq: Once | ORAL | Status: AC
  Administered 2017-12-19: 324 mg via ORAL
  Filled 2017-12-19: qty 4

## 2017-12-19 MED ORDER — ATORVASTATIN CALCIUM 20 MG PO TABS
20.0000 mg | ORAL_TABLET | Freq: Every day | ORAL | Status: DC
  Administered 2017-12-19: 20 mg via ORAL
  Filled 2017-12-19 (×3): qty 1

## 2017-12-19 MED ORDER — ENOXAPARIN SODIUM 40 MG/0.4ML ~~LOC~~ SOLN
40.0000 mg | SUBCUTANEOUS | Status: DC
  Filled 2017-12-19: qty 0.4

## 2017-12-19 MED ORDER — NITROGLYCERIN 0.4 MG SL SUBL
0.4000 mg | SUBLINGUAL_TABLET | SUBLINGUAL | Status: DC | PRN
  Administered 2017-12-19: 0.4 mg via SUBLINGUAL
  Filled 2017-12-19: qty 1

## 2017-12-19 MED ORDER — LORAZEPAM 1 MG PO TABS: 1.0000 mg | ORAL_TABLET | Freq: Four times a day (QID) | ORAL | Status: DC | PRN

## 2017-12-19 MED ORDER — THIAMINE HCL 100 MG/ML IJ SOLN: 100.0000 mg | Freq: Every day | INTRAMUSCULAR | Status: DC

## 2017-12-19 MED ORDER — ACETAMINOPHEN 650 MG RE SUPP: 650.0000 mg | Freq: Four times a day (QID) | RECTAL | Status: DC | PRN

## 2017-12-19 MED ORDER — ALPRAZOLAM 0.5 MG PO TABS
0.5000 mg | ORAL_TABLET | Freq: Two times a day (BID) | ORAL | Status: DC | PRN
  Administered 2017-12-19 – 2017-12-20 (×2): 1 mg via ORAL
  Filled 2017-12-19 (×2): qty 2

## 2017-12-19 MED ORDER — IOPAMIDOL (ISOVUE-370) INJECTION 76%
80.0000 mL | Freq: Once | INTRAVENOUS | Status: AC | PRN
  Administered 2017-12-19: 80 mL via INTRAVENOUS

## 2017-12-19 MED ORDER — PANTOPRAZOLE SODIUM 40 MG PO TBEC
40.0000 mg | DELAYED_RELEASE_TABLET | Freq: Two times a day (BID) | ORAL | Status: DC
  Administered 2017-12-19 – 2017-12-20 (×2): 40 mg via ORAL
  Filled 2017-12-19 (×2): qty 1

## 2017-12-19 NOTE — ED Notes (Signed)
Pt felt dizzy this am, was mostly concerned with high bp

## 2017-12-19 NOTE — Consult Note (Signed)
Cardiology Consultation:   Patient ID: Steve Stone; [id number]; [birth year]   Admit date: 12/19/2017 Date of Consult: 12/19/2017  Primary Care Provider: Curlene Labrum, MD Primary Cardiologist: Previously Dr. Domenic Polite (September 2015) Consulting cardiologist: Dr. Bronson Ing    Patient Profile:   Steve Stone is a 57 y.o. male with a hx of hyperlipidemia and GERD who is being seen today for the evaluation of chest pain at the request of Dr. Carles Collet.  History of Present Illness:   Mr. Steve Stone is a 58 year old male with a history of hyperlipidemia and GERD.  He was evaluated by Dr. Domenic Polite for chest pain in September 2015 and prior to that by Dr. Stanford Breed in 2013. He underwent a low risk nuclear stress test on 07/15/14.  At that time, echocardiogram demonstrated normal left ventricular systolic function, LVEF 96%, mild LVH, and grade 1 diastolic dysfunction.  He presented to the ED this morning with chest pain, dizziness, and an occipital headache.  He told me he initially woke up at 1:30 AM and use the bathroom.  He felt fine and went back to sleep.  His alarm went off at 4:15 in the morning.  When he awoke, he said he was slightly disoriented but this only lasted a few seconds.  He then went to work and on the way to work he had some blurriness of vision and an occipital headache which he described felt like a migraine.  He told me he has a history of migraines but the most recent occurrence was prior to rotator cuff surgery about a year ago.  He then went to work and had some chest pressure.  His blood pressure was checked at work and it was 139/101 and 142/101. He was told to go to the ED.  Blood pressure on arrival was initially 131/94 and subsequently 127/100.  He was given a sublingual nitroglycerin and his blood pressure normalized and his chest pressure resolved.  Today, basic metabolic panel, CBC, and point-of-care troponin were unremarkable.  Chest x-ray showed no active  cardiopulmonary disease.  CT angiogram of the head and neck showed no evidence of acute intracranial abnormality with a patent circle of Willis without proximal branch occlusion, significant stenosis, or aneurysm.  ECGs performed today which I personally reviewed demonstrated sinus bradycardia with incomplete right bundle branch block with some degree of sinus arrhythmia.  This was unchanged from an ECG performed in September 2015.  He was out of work last week for what he said was a stomach infection.  He is also been experiencing some exertional dyspnea while at work.  He chews tobacco.  Past Medical History:  Diagnosis Date  . Esophageal reflux   . Hyperlipidemia     Past Surgical History:  Procedure Laterality Date  . APPENDECTOMY    . Arthroscopic knee surgery Right   . CHOLECYSTECTOMY    . HERNIA REPAIR         Inpatient Medications: Scheduled Meds: . atorvastatin  20 mg Oral q1800  . enoxaparin (LOVENOX) injection  40 mg Subcutaneous Q24H  . folic acid  1 mg Oral Daily  . multivitamin with minerals  1 tablet Oral Daily  . pantoprazole  40 mg Oral BID  . thiamine  100 mg Oral Daily   Or  . thiamine  100 mg Intravenous Daily   Continuous Infusions:  PRN Meds: acetaminophen **OR** acetaminophen, ALPRAZolam, LORazepam **OR** LORazepam, nitroGLYCERIN, ondansetron **OR** ondansetron (ZOFRAN) IV  Allergies:   No Known Allergies  Social History:  Social History   Socioeconomic History  . Marital status: Married    Spouse name: Not on file  . Number of children: 4  . Years of education: Not on file  . Highest education level: Not on file  Social Needs  . Financial resource strain: Not on file  . Food insecurity - worry: Not on file  . Food insecurity - inability: Not on file  . Transportation needs - medical: Not on file  . Transportation needs - non-medical: Not on file  Occupational History    Employer: [employer]  Tobacco Use  . Smoking status:  Never Smoker  . Smokeless tobacco: Current User    Types: Snuff  . Tobacco comment: The patient does use smokeless tobacco and dips  Substance and Sexual Activity  . Alcohol use: Yes    Comment: One beer per night  . Drug use: No  . Sexual activity: Not on file  Other Topics Concern  . Not on file  Social History Narrative  . Not on file    Family History:   No premature CAD.  Family History  Problem Relation Age of Onset  . Heart disease Unknown        Grandparents when they were in the 9s  . Colon cancer Father        Father     ROS:  Please see the history of present illness.   All other ROS reviewed and negative.     Physical Exam/Data:   Vitals:   12/19/17 1400 12/19/17 1430 12/19/17 1500 12/19/17 1526  BP: 116/83 127/78 117/90 124/86  Pulse: (!) 58 (!) 59 (!) 51 64  Resp: 14 14 14 16   Temp:    97.6 F (36.4 C)  TempSrc:    Oral  SpO2: 97% 97% 98% 100%  Weight:    207 lb 12.8 oz (94.3 kg)  Height:    6' (1.829 m)   No intake or output data in the 24 hours ending 12/19/17 1532 Filed Weights   12/19/17 1526  Weight: 207 lb 12.8 oz (94.3 kg)   Body mass index is 28.18 kg/m.  General:  Well nourished, well developed, in no acute distress HEENT: normal Lymph: no adenopathy Neck: no JVD Endocrine:  No thryomegaly Cardiac:  normal S1, S2; RRR; no murmur  Lungs:  clear to auscultation bilaterally, no wheezing, rhonchi or rales  Abd: soft, nontender, no hepatomegaly  Ext: no edema Musculoskeletal:  No deformities, BUE and BLE strength normal and equal Skin: warm and dry  Neuro:  CNs 2-12 intact, no focal abnormalities noted Psych:  Normal affect   EKG:  The EKG was personally reviewed and demonstrates:  See HPI  Telemetry:  Telemetry was personally reviewed and demonstrates: Sinus rhythm with an isolated PAC  Relevant CV Studies: See HPI  Laboratory Data:  Chemistry Recent Labs  Lab 12/19/17 0928 12/19/17 0952  NA 139 141  K 3.7 3.8  CL  105 104  CO2 25  --   GLUCOSE 109* 104*  BUN 13 12  CREATININE 0.95 0.90  CALCIUM 9.2  --   GFRNONAA >60  --   GFRAA >60  --   ANIONGAP 9  --     No results for input(s): PROT, ALBUMIN, AST, ALT, ALKPHOS, BILITOT in the last 168 hours. Hematology Recent Labs  Lab 12/19/17 0928 12/19/17 0952  WBC 5.7  --   RBC 4.94  --   HGB 14.7 15.6  HCT 44.1 46.0  MCV  89.3  --   MCH 29.8  --   MCHC 33.3  --   RDW 13.0  --   PLT 183  --    Cardiac EnzymesNo results for input(s): TROPONINI in the last 168 hours.  Recent Labs  Lab 12/19/17 0951  TROPIPOC 0.00    BNPNo results for input(s): BNP, PROBNP in the last 168 hours.  DDimer No results for input(s): DDIMER in the last 168 hours.  Radiology/Studies:  Ct Angio Head W Or Wo Contrast  Result Date: 12/19/2017 CLINICAL DATA:  Dizziness, confusion, posterior headache, and hypertension. EXAM: CT ANGIOGRAPHY HEAD AND NECK TECHNIQUE: Multidetector CT imaging of the head and neck was performed using the standard protocol during bolus administration of intravenous contrast. Multiplanar CT image reconstructions and MIPs were obtained to evaluate the vascular anatomy. Carotid stenosis measurements (when applicable) are obtained utilizing NASCET criteria, using the distal internal carotid diameter as the denominator. CONTRAST:  105mL ISOVUE-370 IOPAMIDOL (ISOVUE-370) INJECTION 76% COMPARISON:  Brain MRI 10/21/2015. FINDINGS: CT HEAD FINDINGS Brain: There is no evidence of acute infarct, intracranial hemorrhage, mass, midline shift, or extra-axial fluid collection. The ventricles and sulci are normal. Vascular: Minimal carotid siphon calcification. Skull: No fracture or focal osseous lesion. Sinuses: Visualized paranasal sinuses and mastoid air cells are clear. Orbits: Unremarkable. Review of the MIP images confirms the above findings CTA NECK FINDINGS Aortic arch: Standard 3 vessel aortic arch. Widely patent arch vessel origins. Right carotid system:  Patent without evidence of stenosis or dissection. Mild calcified and soft plaque at the carotid bifurcation. Left carotid system: Patent without evidence of stenosis or dissection. Minimal soft plaque at the carotid bifurcation. Vertebral arteries: Patent without evidence of stenosis or dissection. Moderately dominant right vertebral artery. Nonstenotic calcified distal right V1 segment plaque. Skeleton: C5-C7 ACDF with solid osseous fusion at both levels. Other neck: No mass or enlarged lymph nodes. Upper chest: Clear lung apices. Review of the MIP images confirms the above findings CTA HEAD FINDINGS Anterior circulation: The internal carotid arteries are widely patent from skull base to carotid termini with minimal supraclinoid calcification bilaterally. ACAs and MCAs are patent without evidence of proximal branch occlusion or significant stenosis. The anterior communicating artery is unremarkable. No aneurysm or vascular malformation. Posterior circulation: The intracranial vertebral arteries are widely patent to the basilar. PICAs are patent with the left arising from the distal V3 segment. SCAs are patent. The basilar artery is widely patent. There are right larger than left posterior communicating arteries. PCAs are patent without evidence of significant stenosis. No aneurysm or vascular malformation. Venous sinuses: Patent. Anatomic variants: None. Delayed phase: No abnormal enhancement. Review of the MIP images confirms the above findings IMPRESSION: 1. Patent circle of Willis without proximal branch occlusion, significant stenosis, or aneurysm. 2. Minimal cervical carotid artery atherosclerosis without stenosis. 3. No evidence of acute intracranial abnormality. Electronically Signed   By: Logan Bores M.D.   On: 12/19/2017 10:40   Dg Chest 2 View  Result Date: 12/19/2017 CLINICAL DATA:  Chest pain. EXAM: CHEST  2 VIEW COMPARISON:  Chest x-ray dated July 14, 2014. FINDINGS: The heart size and  mediastinal contours are within normal limits. Both lungs are clear. The visualized skeletal structures are unremarkable. IMPRESSION: No active cardiopulmonary disease. Electronically Signed   By: Titus Dubin M.D.   On: 12/19/2017 11:13   Ct Angio Neck W And/or Wo Contrast  Result Date: 12/19/2017 CLINICAL DATA:  Dizziness, confusion, posterior headache, and hypertension. EXAM: CT ANGIOGRAPHY HEAD AND NECK  TECHNIQUE: Multidetector CT imaging of the head and neck was performed using the standard protocol during bolus administration of intravenous contrast. Multiplanar CT image reconstructions and MIPs were obtained to evaluate the vascular anatomy. Carotid stenosis measurements (when applicable) are obtained utilizing NASCET criteria, using the distal internal carotid diameter as the denominator. CONTRAST:  23mL ISOVUE-370 IOPAMIDOL (ISOVUE-370) INJECTION 76% COMPARISON:  Brain MRI 10/21/2015. FINDINGS: CT HEAD FINDINGS Brain: There is no evidence of acute infarct, intracranial hemorrhage, mass, midline shift, or extra-axial fluid collection. The ventricles and sulci are normal. Vascular: Minimal carotid siphon calcification. Skull: No fracture or focal osseous lesion. Sinuses: Visualized paranasal sinuses and mastoid air cells are clear. Orbits: Unremarkable. Review of the MIP images confirms the above findings CTA NECK FINDINGS Aortic arch: Standard 3 vessel aortic arch. Widely patent arch vessel origins. Right carotid system: Patent without evidence of stenosis or dissection. Mild calcified and soft plaque at the carotid bifurcation. Left carotid system: Patent without evidence of stenosis or dissection. Minimal soft plaque at the carotid bifurcation. Vertebral arteries: Patent without evidence of stenosis or dissection. Moderately dominant right vertebral artery. Nonstenotic calcified distal right V1 segment plaque. Skeleton: C5-C7 ACDF with solid osseous fusion at both levels. Other neck: No mass or  enlarged lymph nodes. Upper chest: Clear lung apices. Review of the MIP images confirms the above findings CTA HEAD FINDINGS Anterior circulation: The internal carotid arteries are widely patent from skull base to carotid termini with minimal supraclinoid calcification bilaterally. ACAs and MCAs are patent without evidence of proximal branch occlusion or significant stenosis. The anterior communicating artery is unremarkable. No aneurysm or vascular malformation. Posterior circulation: The intracranial vertebral arteries are widely patent to the basilar. PICAs are patent with the left arising from the distal V3 segment. SCAs are patent. The basilar artery is widely patent. There are right larger than left posterior communicating arteries. PCAs are patent without evidence of significant stenosis. No aneurysm or vascular malformation. Venous sinuses: Patent. Anatomic variants: None. Delayed phase: No abnormal enhancement. Review of the MIP images confirms the above findings IMPRESSION: 1. Patent circle of Willis without proximal branch occlusion, significant stenosis, or aneurysm. 2. Minimal cervical carotid artery atherosclerosis without stenosis. 3. No evidence of acute intracranial abnormality. Electronically Signed   By: Logan Bores M.D.   On: 12/19/2017 10:40   US Venous Img Upper Uni Left  Result Date: 12/19/2017 CLINICAL DATA:  Left upper extremity edema. History of rotator cuff surgery in May of 2018. Evaluate for DVT. EXAM: LEFT UPPER EXTREMITY VENOUS DOPPLER ULTRASOUND TECHNIQUE: Gray-scale sonography with graded compression, as well as color Doppler and duplex ultrasound were performed to evaluate the upper extremity deep venous system from the level of the subclavian vein and including the jugular, axillary, basilic, radial, ulnar and upper cephalic vein. Spectral Doppler was utilized to evaluate flow at rest and with distal augmentation maneuvers. COMPARISON:  None. FINDINGS: Contralateral Subclavian  Vein: Respiratory phasicity is normal and symmetric with the symptomatic side. No evidence of thrombus. Normal compressibility. Internal Jugular Vein: No evidence of thrombus. Normal compressibility, respiratory phasicity and response to augmentation. Subclavian Vein: No evidence of thrombus. Normal compressibility, respiratory phasicity and response to augmentation. Axillary Vein: No evidence of thrombus. Normal compressibility, respiratory phasicity and response to augmentation. Cephalic Vein: No evidence of thrombus. Normal compressibility, respiratory phasicity and response to augmentation. Basilic Vein: No evidence of thrombus. Normal compressibility, respiratory phasicity and response to augmentation. Brachial Veins: No evidence of thrombus. Normal compressibility, respiratory phasicity and response to  augmentation. Radial Veins: No evidence of thrombus. Normal compressibility, respiratory phasicity and response to augmentation. Ulnar Veins: No evidence of thrombus. Normal compressibility, respiratory phasicity and response to augmentation. Venous Reflux:  None visualized. Other Findings:  None visualized. IMPRESSION: No evidence of DVT within the left upper extremity. Electronically Signed   By: Sandi Mariscal M.D.   On: 12/19/2017 11:14    Assessment and Plan:   1.  Chest pain: Symptoms have primarily atypical features but it was relieved with nitroglycerin.  It may have been due to hypertension.  His wife notes that the patient has a lot of anxiety and stress both at home and at work which may have exacerbated his symptoms.   I recommend checking serial troponins.  An echocardiogram has been ordered by internal medicine which I will review.  If left ventricular systolic function and regional wall motion are normal, and serial troponins are normal as well, I think he can follow-up in the outpatient setting.  A stress test may be considered at that time.  If troponins are significantly elevated, he would  warrant coronary angiography. Risk factors for coronary disease include use of chewing tobacco and hyperlipidemia.  2.  Elevated blood pressure (no reported h/o hypertension): Blood pressure is presently normal.  This will require continued monitoring particularly in the outpatient setting.  3.  Hyperlipidemia: Lipid panel has been ordered and is pending.  Continue Lipitor.  4.  Anxiety and stress at home and work: He is currently on alprazolam.   For questions or updates, please contact [facility] Please consult [email or web address] for contact info under Cardiology/STEMI.   Signed, Kate Sable, MD  12/19/2017 3:32 PM

## 2017-12-19 NOTE — ED Notes (Signed)
Pt states symptoms has mostly resolved, mild headache and chest feels like indigestion

## 2017-12-19 NOTE — H&P (Signed)
History and Physical  Steve Stone SNK:[id number] DOB: [birth year] DOA: 12/19/2017   PCP: Curlene Labrum, MD   Patient coming from: Home  Chief Complaint: chest pain and dizziness  HPI:  Steve Stone is a 57 y.o. male with medical history of GERD and hyperlipidemia presented with chest pain, dizziness, and occipital headache.  The patient woke up at 1:30 in the morning on 12/19/2017 to use the bathroom.  He felt his normal self and went back to bed.  He woke up again at 4:30 in the morning at which time he a brief episode of blurry vision lasting only a few seconds with some associated dizziness.  He also developed some substernal chest discomfort.  The patient went to work later the morning.  At work, the patient developed an occipital headache and continued to have dizziness.  He continued to have substernal chest discomfort.  He denied any fevers, chills, shortness breath, nausea, vomiting, syncope.  He denied any dysarthria, dysphasia, visual loss, or focal extremity weakness.  His blood pressure was taken at work and it was 138/94 and 142/101.  He was instructed to go to the emergency department for further evaluation.  In the emergency department, the patient was afebrile hemodynamically stable saturating 99% on room air.  His chest discomfort was completely relieved with one sublingual nitroglycerin.  BMP and CBC were unremarkable.  EKG showed incomplete right bundle branch block which is unchanged from previous.  CT angiogram of the head and neck was negative for any intracranial abnormalities.  There was no occlusion of the circle of Willis.  There was minimal atherosclerotic vascular disease in the bilateral carotids.  Chest x-ray was negative.      Assessment/Plan: Visual disturbance/dizziness -MRI brain -CT angiogram head and neck--negative for hemorrhage;  Neg for hemodynamically significant stenosis -Echocardiogram  Atypical chest pain -Mostly atypical by history,  although was relieved by nitroglycerin -Consult cardiology -Echocardiogram -Cycle troponins  Hyperlipidemia -Continue statin  Elevated blood pressure without hypertension -Monitor blood pressure during hospital stay  Anxiety -continue home dose alprazolam  Alcohol dependence -drinks one Etoh drink daily -CIWA      Past Medical History:  Diagnosis Date  . Esophageal reflux   . Hyperlipidemia    Past Surgical History:  Procedure Laterality Date  . APPENDECTOMY    . Arthroscopic knee surgery Right   . CHOLECYSTECTOMY    . HERNIA REPAIR     Social History:  reports that  has never smoked. His smokeless tobacco use includes snuff. He reports that he drinks alcohol. He reports that he does not use drugs.   Family History  Problem Relation Age of Onset  . Heart disease Unknown        Grandparents when they were in the 33s  . Colon cancer Father        Father     No Known Allergies   Prior to Admission medications   Medication Sig Start Date End Date Taking? Authorizing Provider  ALPRAZolam Duanne Moron) 1 MG tablet Take 0.5-1 mg by mouth 2 (two) times daily as needed for anxiety.   Yes [provider]  atorvastatin (LIPITOR) 20 MG tablet Take 20 mg by mouth daily. 11/01/17  Yes [provider]  pantoprazole (PROTONIX) 40 MG tablet Take 40 mg by mouth 2 (two) times daily. 06/26/14  Yes [provider]    Review of Systems:  Constitutional:  No weight loss, night sweats, Fevers, chills, fatigue.  Head&Eyes: No  headache.  No vision loss.  No eye pain or scotoma ENT:  No Difficulty swallowing,Tooth/dental problems,Sore throat,  No ear ache, post nasal drip,  Cardio-vascular:  No c Orthopnea, PND, swelling in lower extremities,   palpitations  GI:  No  abdominal pain, nausea, vomiting, diarrhea, loss of appetite, hematochezia, melena, heartburn, indigestion, Resp:  No shortness of breath with exertion or at rest. No cough. No coughing up of  blood .No wheezing.No chest wall deformity  Skin:  no rash or lesions.  GU:  no dysuria, change in color of urine, no urgency or frequency. No flank pain.  Musculoskeletal:  No joint pain or swelling. No decreased range of motion. No back pain.  Psych:  No change in mood or affect. No depression or anxiety. Neurologic: No  no dysesthesia, no focal weakness, no vision loss. No syncope  Physical Exam: Vitals:   12/19/17 0924 12/19/17 0944 12/19/17 1000  BP: (!) 131/94 (!) 127/100 113/78  Pulse: 64 60 67  Resp: 17 16 16   Temp: 97.8 F (36.6 C)    TempSrc: Oral    SpO2: 99% 98% 96%   General:  A&O x 3, NAD, nontoxic, pleasant/cooperative Head/Eye: No conjunctival hemorrhage, no icterus, [place]/AT, No nystagmus ENT:  No icterus,  No thrush, good dentition, no pharyngeal exudate Neck:  No masses, no lymphadenpathy, no bruits CV:  RRR, no rub, no gallop, no S3 Lung:  CTAB, good air movement, no wheeze, no rhonchi Abdomen: soft/NT, +BS, nondistended, no peritoneal signs Ext: No cyanosis, No rashes, No petechiae, No lymphangitis, No edema Neuro: CNII-XII intact, strength 4/5 in bilateral upper and lower extremities, no dysmetria  Labs on Admission:  Basic Metabolic Panel: Recent Labs  Lab 12/19/17 0928 12/19/17 0952  NA 139 141  K 3.7 3.8  CL 105 104  CO2 25  --   GLUCOSE 109* 104*  BUN 13 12  CREATININE 0.95 0.90  CALCIUM 9.2  --    Liver Function Tests: No results for input(s): AST, ALT, ALKPHOS, BILITOT, PROT, ALBUMIN in the last 168 hours. No results for input(s): LIPASE, AMYLASE in the last 168 hours. No results for input(s): AMMONIA in the last 168 hours. CBC: Recent Labs  Lab 12/19/17 0928 12/19/17 0952  WBC 5.7  --   NEUTROABS 3.6  --   HGB 14.7 15.6  HCT 44.1 46.0  MCV 89.3  --   PLT 183  --    Coagulation Profile: Recent Labs  Lab 12/19/17 0928  INR 0.95   Cardiac Enzymes: No results for input(s): CKTOTAL, CKMB, CKMBINDEX, TROPONINI in the last 168  hours. BNP: Invalid input(s): POCBNP CBG: No results for input(s): GLUCAP in the last 168 hours. Urine analysis: No results found for: COLORURINE, APPEARANCEUR, LABSPEC, PHURINE, GLUCOSEU, HGBUR, BILIRUBINUR, KETONESUR, PROTEINUR, UROBILINOGEN, NITRITE, LEUKOCYTESUR Sepsis Labs: @LABRCNTIP (procalcitonin:4,lacticidven:4) )No results found for this or any previous visit (from the past 240 hour(s)).   Radiological Exams on Admission: Ct Angio Head W Or Wo Contrast  Result Date: 12/19/2017 CLINICAL DATA:  Dizziness, confusion, posterior headache, and hypertension. EXAM: CT ANGIOGRAPHY HEAD AND NECK TECHNIQUE: Multidetector CT imaging of the head and neck was performed using the standard protocol during bolus administration of intravenous contrast. Multiplanar CT image reconstructions and MIPs were obtained to evaluate the vascular anatomy. Carotid stenosis measurements (when applicable) are obtained utilizing NASCET criteria, using the distal internal carotid diameter as the denominator. CONTRAST:  53mL ISOVUE-370 IOPAMIDOL (ISOVUE-370) INJECTION 76% COMPARISON:  Brain MRI 10/21/2015. FINDINGS: CT HEAD FINDINGS Brain:  There is no evidence of acute infarct, intracranial hemorrhage, mass, midline shift, or extra-axial fluid collection. The ventricles and sulci are normal. Vascular: Minimal carotid siphon calcification. Skull: No fracture or focal osseous lesion. Sinuses: Visualized paranasal sinuses and mastoid air cells are clear. Orbits: Unremarkable. Review of the MIP images confirms the above findings CTA NECK FINDINGS Aortic arch: Standard 3 vessel aortic arch. Widely patent arch vessel origins. Right carotid system: Patent without evidence of stenosis or dissection. Mild calcified and soft plaque at the carotid bifurcation. Left carotid system: Patent without evidence of stenosis or dissection. Minimal soft plaque at the carotid bifurcation. Vertebral arteries: Patent without evidence of stenosis or  dissection. Moderately dominant right vertebral artery. Nonstenotic calcified distal right V1 segment plaque. Skeleton: C5-C7 ACDF with solid osseous fusion at both levels. Other neck: No mass or enlarged lymph nodes. Upper chest: Clear lung apices. Review of the MIP images confirms the above findings CTA HEAD FINDINGS Anterior circulation: The internal carotid arteries are widely patent from skull base to carotid termini with minimal supraclinoid calcification bilaterally. ACAs and MCAs are patent without evidence of proximal branch occlusion or significant stenosis. The anterior communicating artery is unremarkable. No aneurysm or vascular malformation. Posterior circulation: The intracranial vertebral arteries are widely patent to the basilar. PICAs are patent with the left arising from the distal V3 segment. SCAs are patent. The basilar artery is widely patent. There are right larger than left posterior communicating arteries. PCAs are patent without evidence of significant stenosis. No aneurysm or vascular malformation. Venous sinuses: Patent. Anatomic variants: None. Delayed phase: No abnormal enhancement. Review of the MIP images confirms the above findings IMPRESSION: 1. Patent circle of Willis without proximal branch occlusion, significant stenosis, or aneurysm. 2. Minimal cervical carotid artery atherosclerosis without stenosis. 3. No evidence of acute intracranial abnormality. Electronically Signed   By: Logan Bores M.D.   On: 12/19/2017 10:40   Dg Chest 2 View  Result Date: 12/19/2017 CLINICAL DATA:  Chest pain. EXAM: CHEST  2 VIEW COMPARISON:  Chest x-ray dated July 14, 2014. FINDINGS: The heart size and mediastinal contours are within normal limits. Both lungs are clear. The visualized skeletal structures are unremarkable. IMPRESSION: No active cardiopulmonary disease. Electronically Signed   By: Titus Dubin M.D.   On: 12/19/2017 11:13   Ct Angio Neck W And/or Wo Contrast  Result Date:  12/19/2017 CLINICAL DATA:  Dizziness, confusion, posterior headache, and hypertension. EXAM: CT ANGIOGRAPHY HEAD AND NECK TECHNIQUE: Multidetector CT imaging of the head and neck was performed using the standard protocol during bolus administration of intravenous contrast. Multiplanar CT image reconstructions and MIPs were obtained to evaluate the vascular anatomy. Carotid stenosis measurements (when applicable) are obtained utilizing NASCET criteria, using the distal internal carotid diameter as the denominator. CONTRAST:  60mL ISOVUE-370 IOPAMIDOL (ISOVUE-370) INJECTION 76% COMPARISON:  Brain MRI 10/21/2015. FINDINGS: CT HEAD FINDINGS Brain: There is no evidence of acute infarct, intracranial hemorrhage, mass, midline shift, or extra-axial fluid collection. The ventricles and sulci are normal. Vascular: Minimal carotid siphon calcification. Skull: No fracture or focal osseous lesion. Sinuses: Visualized paranasal sinuses and mastoid air cells are clear. Orbits: Unremarkable. Review of the MIP images confirms the above findings CTA NECK FINDINGS Aortic arch: Standard 3 vessel aortic arch. Widely patent arch vessel origins. Right carotid system: Patent without evidence of stenosis or dissection. Mild calcified and soft plaque at the carotid bifurcation. Left carotid system: Patent without evidence of stenosis or dissection. Minimal soft plaque at the carotid bifurcation.  Vertebral arteries: Patent without evidence of stenosis or dissection. Moderately dominant right vertebral artery. Nonstenotic calcified distal right V1 segment plaque. Skeleton: C5-C7 ACDF with solid osseous fusion at both levels. Other neck: No mass or enlarged lymph nodes. Upper chest: Clear lung apices. Review of the MIP images confirms the above findings CTA HEAD FINDINGS Anterior circulation: The internal carotid arteries are widely patent from skull base to carotid termini with minimal supraclinoid calcification bilaterally. ACAs and MCAs are  patent without evidence of proximal branch occlusion or significant stenosis. The anterior communicating artery is unremarkable. No aneurysm or vascular malformation. Posterior circulation: The intracranial vertebral arteries are widely patent to the basilar. PICAs are patent with the left arising from the distal V3 segment. SCAs are patent. The basilar artery is widely patent. There are right larger than left posterior communicating arteries. PCAs are patent without evidence of significant stenosis. No aneurysm or vascular malformation. Venous sinuses: Patent. Anatomic variants: None. Delayed phase: No abnormal enhancement. Review of the MIP images confirms the above findings IMPRESSION: 1. Patent circle of Willis without proximal branch occlusion, significant stenosis, or aneurysm. 2. Minimal cervical carotid artery atherosclerosis without stenosis. 3. No evidence of acute intracranial abnormality. Electronically Signed   By: Logan Bores M.D.   On: 12/19/2017 10:40   US Venous Img Upper Uni Left  Result Date: 12/19/2017 CLINICAL DATA:  Left upper extremity edema. History of rotator cuff surgery in May of 2018. Evaluate for DVT. EXAM: LEFT UPPER EXTREMITY VENOUS DOPPLER ULTRASOUND TECHNIQUE: Gray-scale sonography with graded compression, as well as color Doppler and duplex ultrasound were performed to evaluate the upper extremity deep venous system from the level of the subclavian vein and including the jugular, axillary, basilic, radial, ulnar and upper cephalic vein. Spectral Doppler was utilized to evaluate flow at rest and with distal augmentation maneuvers. COMPARISON:  None. FINDINGS: Contralateral Subclavian Vein: Respiratory phasicity is normal and symmetric with the symptomatic side. No evidence of thrombus. Normal compressibility. Internal Jugular Vein: No evidence of thrombus. Normal compressibility, respiratory phasicity and response to augmentation. Subclavian Vein: No evidence of thrombus. Normal  compressibility, respiratory phasicity and response to augmentation. Axillary Vein: No evidence of thrombus. Normal compressibility, respiratory phasicity and response to augmentation. Cephalic Vein: No evidence of thrombus. Normal compressibility, respiratory phasicity and response to augmentation. Basilic Vein: No evidence of thrombus. Normal compressibility, respiratory phasicity and response to augmentation. Brachial Veins: No evidence of thrombus. Normal compressibility, respiratory phasicity and response to augmentation. Radial Veins: No evidence of thrombus. Normal compressibility, respiratory phasicity and response to augmentation. Ulnar Veins: No evidence of thrombus. Normal compressibility, respiratory phasicity and response to augmentation. Venous Reflux:  None visualized. Other Findings:  None visualized. IMPRESSION: No evidence of DVT within the left upper extremity. Electronically Signed   By: Sandi Mariscal M.D.   On: 12/19/2017 11:14    EKG: Independently reviewed. Sinus, IRBBB    Time spent:60 minutes Code Status:   FULL Family Communication: spouse updated at bedside Disposition Plan: expect 1 day hospitalization Consults called: cardiology DVT Prophylaxis: [place] Lovenox  Orson Eva, DO  [facility] Pager [phone]  If 7PM-7AM, please contact night-coverage [email or web address] Password [facility] 12/19/2017, 12:03 PM

## 2017-12-19 NOTE — ED Notes (Signed)
Pt drink a soda and coffee this morning, last cup at 6Am

## 2017-12-19 NOTE — ED Notes (Signed)
Pt under a lot of stress

## 2017-12-19 NOTE — ED Notes (Signed)
Returned from CT.

## 2017-12-19 NOTE — ED Notes (Signed)
Pt in CT.

## 2017-12-19 NOTE — ED Notes (Signed)
Heated lunch tray for pt, Heart healthy diet order

## 2017-12-19 NOTE — ED Triage Notes (Signed)
Pt woke at 0130 to use bathroom and states he was normal. Woke up at 0430 dizzy and confused of where he was. bp high at work. Back of head hurts. Mid chest pain that is aching rating 3. C/o left hand swelling this am, rotator surg hx. Color wnl.

## 2017-12-19 NOTE — ED Provider Notes (Signed)
[facility] EMERGENCY DEPARTMENT Provider Note   CSN: [id number] Arrival date & time: 12/19/17  3299     History   Chief Complaint Chief Complaint  Patient presents with  . Dizziness  . Chest Pain    HPI Steve Stone is a 57 y.o. male.  HPI  Pt was seen at 0945. Per pt and his wife, c/o gradual onset and slow improvement of multiple symptoms that he noticed when he woke up approximately 0430/0445 this morning. Pt states he woke up to use the bathroom at 0130 and "was fine." States he went back to sleep, woke again 0430/0445 and felt: lightheaded, confused, occipital headache, blurry vision, left arm/hand "swelling," and mid-sternal chest pain described as "indigestion." Describes the headache as "aching." States he went to work and was told his "BP was high." Pt states his confusion, lightheadedness, and blurry vision are improving.  Denies headache was sudden or maximal in onset or at any time.  Denies focal motor weakness, no tingling/numbness in extremities, no fevers, no neck pain, no rash, no palpitations, no SOB/cough, no abd pain, no N/V/D, no back pain, no injury.     Past Medical History:  Diagnosis Date  . Esophageal reflux   . Hyperlipidemia     Patient Active Problem List   Diagnosis Date Noted  . Visual disturbance 12/19/2017  . Mixed hyperlipidemia 07/15/2014  . Emotional stress 07/15/2014  . Precordial pain 07/14/2014    Past Surgical History:  Procedure Laterality Date  . APPENDECTOMY    . Arthroscopic knee surgery Right   . CHOLECYSTECTOMY    . HERNIA REPAIR         Home Medications    Prior to Admission medications   Medication Sig Start Date End Date Taking? Authorizing Provider  ALPRAZolam Duanne Moron) 1 MG tablet Take 0.5-1 mg by mouth 2 (two) times daily as needed for anxiety.   Yes [provider]  atorvastatin (LIPITOR) 20 MG tablet Take 20 mg by mouth daily. 11/01/17  Yes [provider]  pantoprazole (PROTONIX) 40 MG  tablet Take 40 mg by mouth 2 (two) times daily. 06/26/14  Yes [provider]    Family History Family History  Problem Relation Age of Onset  . Heart disease Unknown        Grandparents when they were in the 28s  . Colon cancer Father        Father    Social History Social History   Tobacco Use  . Smoking status: Never Smoker  . Smokeless tobacco: Current User    Types: Snuff  . Tobacco comment: The patient does use smokeless tobacco and dips  Substance Use Topics  . Alcohol use: Yes    Comment: One beer per night  . Drug use: No     Allergies   Patient has no known allergies.   Review of Systems Review of Systems ROS: Statement: All systems negative except as marked or noted in the HPI; Constitutional: Negative for fever and chills. ; ; Eyes: +"blurry vision." Negative for eye pain, redness and discharge. ; ; ENMT: Negative for ear pain, hoarseness, nasal congestion, sinus pressure and sore throat. ; ; Cardiovascular: +CP. Negative for palpitations, diaphoresis, dyspnea and peripheral edema. ; ; Respiratory: Negative for cough, wheezing and stridor. ; ; Gastrointestinal: Negative for nausea, vomiting, diarrhea, abdominal pain, blood in stool, hematemesis, jaundice and rectal bleeding. . ; ; Genitourinary: Negative for dysuria, flank pain and hematuria. ; ; Musculoskeletal: Negative for back pain  and neck pain. Negative for swelling and trauma.; ; Skin: Negative for pruritus, rash, abrasions, blisters, bruising and skin lesion.; ; Neuro: +confusion, lightheadedness, headache. Negative for neck stiffness. Negative for weakness, altered level of consciousness, altered mental status, extremity weakness, paresthesias, involuntary movement, seizure and syncope.       Physical Exam Updated Vital Signs BP 113/78   Pulse 67   Temp 97.8 F (36.6 C) (Oral)   Resp 16   SpO2 96%    Patient Vitals for the past 24 hrs:  BP Temp Temp src Pulse Resp SpO2  12/19/17 1000  113/78 - - 67 16 96 %  12/19/17 0944 (!) 127/100 - - 60 16 98 %  12/19/17 0924 (!) 131/94 97.8 F (36.6 C) Oral 64 17 99 %     Physical Exam 0950: Physical examination:  Nursing notes reviewed; Vital signs and O2 SAT reviewed;  Constitutional: Well developed, Well nourished, Well hydrated, In no acute distress; Head:  Normocephalic, atraumatic; Eyes: EOMI, PERRL, No scleral icterus; ENMT: Mouth and pharynx normal, Mucous membranes moist; Neck: Supple, Full range of motion, No lymphadenopathy; Cardiovascular: Regular rate and rhythm, No gallop; Respiratory: Breath sounds clear & equal bilaterally, No wheezes.  Speaking full sentences with ease, Normal respiratory effort/excursion; Chest: Nontender, Movement normal; Abdomen: Soft, Nontender, Nondistended, Normal bowel sounds; Genitourinary: No CVA tenderness; Spine:  No midline CS, TS, LS tenderness.;; Extremities: Pulses normal, No tenderness, no deformity. +left hand with mild generalized edema, NMS intact, brisk cap refill in fingertips. LUE muscles compartments soft.  No calf edema or asymmetry.; Neuro: AA&Ox3, Major CN grossly intact. No facial droop. Speech clear. No gross focal motor or sensory deficits in extremities.; Skin: Color normal, Warm, Dry.   ED Treatments / Results  Labs (all labs ordered are listed, but only abnormal results are displayed)   EKG  EKG Interpretation  Date/Time:  Wednesday December 19 2017 09:34:35 EST Ventricular Rate:  56 PR Interval:    QRS Duration: 116 QT Interval:  438 QTC Calculation: 423 R Axis:   69 Text Interpretation:  Sinus rhythm Incomplete right bundle branch block Baseline wander When compared with ECG of 07/15/2014 No significant change was found Confirmed by Francine Graven [phone]) on 12/19/2017 9:52:19 AM       Radiology   Procedures Procedures (including critical care time)  Medications Ordered in ED Medications  nitroGLYCERIN (NITROSTAT) SL tablet 0.4 mg (0.4 mg Sublingual  Given 12/19/17 0956)  aspirin chewable tablet 324 mg (not administered)  iopamidol (ISOVUE-370) 76 % injection 80 mL (80 mLs Intravenous Contrast Given 12/19/17 1000)     Initial Impression / Assessment and Plan / ED Course  I have reviewed the triage vital signs and the nursing notes.  Pertinent labs & imaging results that were available during my care of the patient were reviewed by me and considered in my medical decision making (see chart for details).  MDM Reviewed: previous chart, nursing note and vitals Reviewed previous: labs and ECG Interpretation: labs, ECG, x-ray, CT scan and ultrasound    Results for orders placed or performed during the hospital encounter of 73/41/93  Basic metabolic panel  Result Value Ref Range   Sodium 139 135 - 145 mmol/L   Potassium 3.7 3.5 - 5.1 mmol/L   Chloride 105 101 - 111 mmol/L   CO2 25 22 - 32 mmol/L   Glucose, Bld 109 (H) 65 - 99 mg/dL   BUN 13 6 - 20 mg/dL   Creatinine, Ser 0.95 0.61 -  1.24 mg/dL   Calcium 9.2 8.9 - 10.3 mg/dL   GFR calc non Af Amer >60 >60 mL/min   GFR calc Af Amer >60 >60 mL/min   Anion gap 9 5 - 15  CBC with Differential  Result Value Ref Range   WBC 5.7 4.0 - 10.5 K/uL   RBC 4.94 4.22 - 5.81 MIL/uL   Hemoglobin 14.7 13.0 - 17.0 g/dL   HCT 44.1 39.0 - 52.0 %   MCV 89.3 78.0 - 100.0 fL   MCH 29.8 26.0 - 34.0 pg   MCHC 33.3 30.0 - 36.0 g/dL   RDW 13.0 11.5 - 15.5 %   Platelets 183 150 - 400 K/uL   Neutrophils Relative % 63 %   Neutro Abs 3.6 1.7 - 7.7 K/uL   Lymphocytes Relative 28 %   Lymphs Abs 1.6 0.7 - 4.0 K/uL   Monocytes Relative 8 %   Monocytes Absolute 0.5 0.1 - 1.0 K/uL   Eosinophils Relative 1 %   Eosinophils Absolute 0.0 0.0 - 0.7 K/uL   Basophils Relative 0 %   Basophils Absolute 0.0 0.0 - 0.1 K/uL  Protime-INR  Result Value Ref Range   Prothrombin Time 12.6 11.4 - 15.2 seconds   INR 0.95   APTT  Result Value Ref Range   aPTT 29 24 - 36 seconds  I-stat troponin, ED  Result Value Ref  Range   Troponin i, poc 0.00 0.00 - 0.08 ng/mL   Comment 3          I-Stat Chem 8, ED  Result Value Ref Range   Sodium 141 135 - 145 mmol/L   Potassium 3.8 3.5 - 5.1 mmol/L   Chloride 104 101 - 111 mmol/L   BUN 12 6 - 20 mg/dL   Creatinine, Ser 0.90 0.61 - 1.24 mg/dL   Glucose, Bld 104 (H) 65 - 99 mg/dL   Calcium, Ion 1.14 (L) 1.15 - 1.40 mmol/L   TCO2 25 22 - 32 mmol/L   Hemoglobin 15.6 13.0 - 17.0 g/dL   HCT 46.0 39.0 - 52.0 %   Ct Angio Head W Or Wo Contrast Result Date: 12/19/2017 CLINICAL DATA:  Dizziness, confusion, posterior headache, and hypertension. EXAM: CT ANGIOGRAPHY HEAD AND NECK TECHNIQUE: Multidetector CT imaging of the head and neck was performed using the standard protocol during bolus administration of intravenous contrast. Multiplanar CT image reconstructions and MIPs were obtained to evaluate the vascular anatomy. Carotid stenosis measurements (when applicable) are obtained utilizing NASCET criteria, using the distal internal carotid diameter as the denominator. CONTRAST:  40mL ISOVUE-370 IOPAMIDOL (ISOVUE-370) INJECTION 76% COMPARISON:  Brain MRI 10/21/2015. FINDINGS: CT HEAD FINDINGS Brain: There is no evidence of acute infarct, intracranial hemorrhage, mass, midline shift, or extra-axial fluid collection. The ventricles and sulci are normal. Vascular: Minimal carotid siphon calcification. Skull: No fracture or focal osseous lesion. Sinuses: Visualized paranasal sinuses and mastoid air cells are clear. Orbits: Unremarkable. Review of the MIP images confirms the above findings CTA NECK FINDINGS Aortic arch: Standard 3 vessel aortic arch. Widely patent arch vessel origins. Right carotid system: Patent without evidence of stenosis or dissection. Mild calcified and soft plaque at the carotid bifurcation. Left carotid system: Patent without evidence of stenosis or dissection. Minimal soft plaque at the carotid bifurcation. Vertebral arteries: Patent without evidence of stenosis or  dissection. Moderately dominant right vertebral artery. Nonstenotic calcified distal right V1 segment plaque. Skeleton: C5-C7 ACDF with solid osseous fusion at both levels. Other neck: No mass or enlarged lymph  nodes. Upper chest: Clear lung apices. Review of the MIP images confirms the above findings CTA HEAD FINDINGS Anterior circulation: The internal carotid arteries are widely patent from skull base to carotid termini with minimal supraclinoid calcification bilaterally. ACAs and MCAs are patent without evidence of proximal branch occlusion or significant stenosis. The anterior communicating artery is unremarkable. No aneurysm or vascular malformation. Posterior circulation: The intracranial vertebral arteries are widely patent to the basilar. PICAs are patent with the left arising from the distal V3 segment. SCAs are patent. The basilar artery is widely patent. There are right larger than left posterior communicating arteries. PCAs are patent without evidence of significant stenosis. No aneurysm or vascular malformation. Venous sinuses: Patent. Anatomic variants: None. Delayed phase: No abnormal enhancement. Review of the MIP images confirms the above findings IMPRESSION: 1. Patent circle of Willis without proximal branch occlusion, significant stenosis, or aneurysm. 2. Minimal cervical carotid artery atherosclerosis without stenosis. 3. No evidence of acute intracranial abnormality. Electronically Signed   By: Logan Bores M.D.   On: 12/19/2017 10:40   Dg Chest 2 View Result Date: 12/19/2017 CLINICAL DATA:  Chest pain. EXAM: CHEST  2 VIEW COMPARISON:  Chest x-ray dated July 14, 2014. FINDINGS: The heart size and mediastinal contours are within normal limits. Both lungs are clear. The visualized skeletal structures are unremarkable. IMPRESSION: No active cardiopulmonary disease. Electronically Signed   By: Titus Dubin M.D.   On: 12/19/2017 11:13   Ct Angio Neck W And/or Wo Contrast Result Date:  12/19/2017 CLINICAL DATA:  Dizziness, confusion, posterior headache, and hypertension. EXAM: CT ANGIOGRAPHY HEAD AND NECK TECHNIQUE: Multidetector CT imaging of the head and neck was performed using the standard protocol during bolus administration of intravenous contrast. Multiplanar CT image reconstructions and MIPs were obtained to evaluate the vascular anatomy. Carotid stenosis measurements (when applicable) are obtained utilizing NASCET criteria, using the distal internal carotid diameter as the denominator. CONTRAST:  36mL ISOVUE-370 IOPAMIDOL (ISOVUE-370) INJECTION 76% COMPARISON:  Brain MRI 10/21/2015. FINDINGS: CT HEAD FINDINGS Brain: There is no evidence of acute infarct, intracranial hemorrhage, mass, midline shift, or extra-axial fluid collection. The ventricles and sulci are normal. Vascular: Minimal carotid siphon calcification. Skull: No fracture or focal osseous lesion. Sinuses: Visualized paranasal sinuses and mastoid air cells are clear. Orbits: Unremarkable. Review of the MIP images confirms the above findings CTA NECK FINDINGS Aortic arch: Standard 3 vessel aortic arch. Widely patent arch vessel origins. Right carotid system: Patent without evidence of stenosis or dissection. Mild calcified and soft plaque at the carotid bifurcation. Left carotid system: Patent without evidence of stenosis or dissection. Minimal soft plaque at the carotid bifurcation. Vertebral arteries: Patent without evidence of stenosis or dissection. Moderately dominant right vertebral artery. Nonstenotic calcified distal right V1 segment plaque. Skeleton: C5-C7 ACDF with solid osseous fusion at both levels. Other neck: No mass or enlarged lymph nodes. Upper chest: Clear lung apices. Review of the MIP images confirms the above findings CTA HEAD FINDINGS Anterior circulation: The internal carotid arteries are widely patent from skull base to carotid termini with minimal supraclinoid calcification bilaterally. ACAs and MCAs are  patent without evidence of proximal branch occlusion or significant stenosis. The anterior communicating artery is unremarkable. No aneurysm or vascular malformation. Posterior circulation: The intracranial vertebral arteries are widely patent to the basilar. PICAs are patent with the left arising from the distal V3 segment. SCAs are patent. The basilar artery is widely patent. There are right larger than left posterior communicating arteries. PCAs are patent  without evidence of significant stenosis. No aneurysm or vascular malformation. Venous sinuses: Patent. Anatomic variants: None. Delayed phase: No abnormal enhancement. Review of the MIP images confirms the above findings IMPRESSION: 1. Patent circle of Willis without proximal branch occlusion, significant stenosis, or aneurysm. 2. Minimal cervical carotid artery atherosclerosis without stenosis. 3. No evidence of acute intracranial abnormality. Electronically Signed   By: Logan Bores M.D.   On: 12/19/2017 10:40   US Venous Img Upper Uni Left Result Date: 12/19/2017 CLINICAL DATA:  Left upper extremity edema. History of rotator cuff surgery in May of 2018. Evaluate for DVT. EXAM: LEFT UPPER EXTREMITY VENOUS DOPPLER ULTRASOUND TECHNIQUE: Gray-scale sonography with graded compression, as well as color Doppler and duplex ultrasound were performed to evaluate the upper extremity deep venous system from the level of the subclavian vein and including the jugular, axillary, basilic, radial, ulnar and upper cephalic vein. Spectral Doppler was utilized to evaluate flow at rest and with distal augmentation maneuvers. COMPARISON:  None. FINDINGS: Contralateral Subclavian Vein: Respiratory phasicity is normal and symmetric with the symptomatic side. No evidence of thrombus. Normal compressibility. Internal Jugular Vein: No evidence of thrombus. Normal compressibility, respiratory phasicity and response to augmentation. Subclavian Vein: No evidence of thrombus. Normal  compressibility, respiratory phasicity and response to augmentation. Axillary Vein: No evidence of thrombus. Normal compressibility, respiratory phasicity and response to augmentation. Cephalic Vein: No evidence of thrombus. Normal compressibility, respiratory phasicity and response to augmentation. Basilic Vein: No evidence of thrombus. Normal compressibility, respiratory phasicity and response to augmentation. Brachial Veins: No evidence of thrombus. Normal compressibility, respiratory phasicity and response to augmentation. Radial Veins: No evidence of thrombus. Normal compressibility, respiratory phasicity and response to augmentation. Ulnar Veins: No evidence of thrombus. Normal compressibility, respiratory phasicity and response to augmentation. Venous Reflux:  None visualized. Other Findings:  None visualized. IMPRESSION: No evidence of DVT within the left upper extremity. Electronically Signed   By: Sandi Mariscal M.D.   On: 12/19/2017 11:14    1140:  Pt woke up with neuro symptoms and has had slow improvement of them since onset. Workup reassuring. ASA and SL ntg given for CP with complete improvement. Pt states he "feels better now." Will need further neuro/TIA and cardiac workup. Dx and testing d/w pt and family.  Questions answered.  Verb understanding, agreeable to admit.  T/C to [facility] Dr. Carles Collet, case discussed, including:  HPI, pertinent PM/SHx, VS/PE, dx testing, ED course and treatment:  Agreeable to admit.     Final Clinical Impressions(s) / ED Diagnoses   Final diagnoses:  None    ED Discharge Orders    None       Francine Graven, DO 12/21/17 1236

## 2017-12-20 ENCOUNTER — Observation Stay (HOSPITAL_BASED_OUTPATIENT_CLINIC_OR_DEPARTMENT_OTHER): Payer: BLUE CROSS/BLUE SHIELD

## 2017-12-20 DIAGNOSIS — R0789 Other chest pain: Secondary | ICD-10-CM | POA: Diagnosis not present

## 2017-12-20 DIAGNOSIS — R51 Headache: Secondary | ICD-10-CM | POA: Diagnosis not present

## 2017-12-20 DIAGNOSIS — R079 Chest pain, unspecified: Secondary | ICD-10-CM | POA: Diagnosis not present

## 2017-12-20 DIAGNOSIS — H538 Other visual disturbances: Secondary | ICD-10-CM | POA: Diagnosis not present

## 2017-12-20 DIAGNOSIS — R030 Elevated blood-pressure reading, without diagnosis of hypertension: Secondary | ICD-10-CM | POA: Diagnosis not present

## 2017-12-20 DIAGNOSIS — E782 Mixed hyperlipidemia: Secondary | ICD-10-CM | POA: Diagnosis not present

## 2017-12-20 DIAGNOSIS — R410 Disorientation, unspecified: Secondary | ICD-10-CM | POA: Diagnosis not present

## 2017-12-20 LAB — ECHOCARDIOGRAM COMPLETE
E decel time: 218 msec
EERAT: 7.97
FS: 32 % (ref 28–44)
Height: 72 in
IV/PV OW: 1
LA vol A4C: 34.5 ml
LA vol index: 21.7 mL/m2
LADIAMINDEX: 1.45 cm/m2
LASIZE: 32 mm
LAVOL: 47.8 mL
LEFT ATRIUM END SYS DIAM: 32 mm
LV SIMPSON'S DISK: 57
LV TDI E'LATERAL: 10.4
LV dias vol index: 48 mL/m2
LVDIAVOL: 106 mL (ref 62–150)
LVEEAVG: 7.97
LVEEMED: 7.97
LVELAT: 10.4 cm/s
LVOT SV: 78 mL
LVOT VTI: 18.8 cm
LVOT area: 4.15 cm2
LVOT diameter: 23 mm
LVOT peak grad rest: 4 mmHg
LVOT peak vel: 102 cm/s
LVSYSVOL: 45 mL
LVSYSVOLIN: 21 mL/m2
Lateral S' vel: 11 cm/s
MV Dec: 218
MVPG: 3 mmHg
MVPKAVEL: 79.8 m/s
MVPKEVEL: 82.9 m/s
PW: 9.55 mm — AB (ref 0.6–1.1)
Stroke v: 61 ml
TAPSE: 18.4 mm
TDI e' medial: 8.49
Weight: 3324.8 oz

## 2017-12-20 LAB — BASIC METABOLIC PANEL
Anion gap: 9 (ref 5–15)
BUN: 13 mg/dL (ref 6–20)
CALCIUM: 8.8 mg/dL — AB (ref 8.9–10.3)
CO2: 24 mmol/L (ref 22–32)
Chloride: 107 mmol/L (ref 101–111)
Creatinine, Ser: 1.06 mg/dL (ref 0.61–1.24)
GFR calc non Af Amer: >60 mL/min (ref >60)
Glucose, Bld: 110 mg/dL — ABNORMAL HIGH (ref 65–99)
Potassium: 3.3 mmol/L — ABNORMAL LOW (ref 3.5–5.1)
SODIUM: 140 mmol/L (ref 135–145)

## 2017-12-20 LAB — LIPID PANEL
CHOLESTEROL: 142 mg/dL (ref 0–200)
HDL: 45 mg/dL (ref >40)
LDL CALC: 77 mg/dL (ref 0–99)
TRIGLYCERIDES: 99 mg/dL (ref <150)
Total CHOL/HDL Ratio: 3.2 RATIO
VLDL: 20 mg/dL (ref 0–40)

## 2017-12-20 LAB — TROPONIN I: Troponin I: <0.03 ng/mL (ref <0.03)

## 2017-12-20 LAB — HIV ANTIBODY (ROUTINE TESTING W REFLEX): HIV Screen 4th Generation wRfx: NONREACTIVE

## 2017-12-20 MED ORDER — POTASSIUM CHLORIDE CRYS ER 20 MEQ PO TBCR
40.0000 meq | EXTENDED_RELEASE_TABLET | Freq: Once | ORAL | Status: AC
  Administered 2017-12-20: 40 meq via ORAL
  Filled 2017-12-20: qty 2

## 2017-12-20 MED ORDER — ASPIRIN EC 81 MG PO TBEC: 81.0000 mg | DELAYED_RELEASE_TABLET | Freq: Every day | ORAL | 0 refills | Status: AC

## 2017-12-20 NOTE — Progress Notes (Signed)
*  PRELIMINARY RESULTS* Echocardiogram 2D Echocardiogram has been performed.  Steve Stone 12/20/2017, 11:27 AM

## 2017-12-20 NOTE — Progress Notes (Signed)
    Evaluated by Dr. Bronson Ing on 12/19/2017 for chest pain which was thought to be atypical for a cardiac etiology. Cyclic troponin values have been obtained and negative. EKG shows known incomplete RBBB but no acute ischemic changes. As outlined yesterday, will obtain an echocardiogram to assess LV function and wall motion (will update order to anticipated discharge) and if no significant abnormalities are noted, can follow-up in the outpatient setting.   Signed, Erma Heritage, PA-C 12/20/2017, 7:43 AM Pager: [phone]   Attending note EKG and enzymes not conistent with ACS, presents with atypical chest pain. Echo with normal LVEF and no WMAs. Will arrange outpatient follow up for patient, no further testing planned as inpatient.   Carlyle Dolly MD

## 2017-12-20 NOTE — Progress Notes (Signed)
Pt discharged home today per Dr. Carles Collet. Pt's IV site D/C'd and WDL. Pt's VSS. Pt provided with home medication list and discharge instructions. Verbalized understanding. Pt ambulated off floor in stable condition accompanied by RN.

## 2017-12-20 NOTE — Discharge Summary (Signed)
Physician Discharge Summary  Steve Stone PXT:[id number] DOB: [birth year] DOA: 12/19/2017  PCP: Curlene Labrum, MD  Admit date: 12/19/2017 Discharge date: 12/20/2017  Admitted From: Home Disposition:  Home   Recommendations for Outpatient Follow-up:  1. Follow up with PCP in 1-2 weeks 2. Please obtain BMP/CBC in one week   Discharge Condition: Stable CODE STATUS: FULL Diet recommendation: Heart Healthy   Brief/Interim Summary:  57 y.o. male with medical history of GERD and hyperlipidemia presented with chest pain, dizziness, and occipital headache.  The patient woke up at 1:30 in the morning on 12/19/2017 to use the bathroom.  He felt his normal self and went back to bed.  He woke up again at 4:30 in the morning at which time he a brief episode of blurry vision lasting only a few seconds with some associated dizziness.  He also developed some substernal chest discomfort.  The patient went to work later the morning.  At work, the patient developed an occipital headache and continued to have dizziness.  He continued to have substernal chest discomfort.  He denied any fevers, chills, shortness breath, nausea, vomiting, syncope.  He denied any dysarthria, dysphasia, visual loss, or focal extremity weakness.  His blood pressure was taken at work and it was 138/94 and 142/101.  He was instructed to go to the emergency department for further evaluation.  In the emergency department, the patient was afebrile hemodynamically stable saturating 99% on room air.  His chest discomfort was completely relieved with one sublingual nitroglycerin.  BMP and CBC were unremarkable.  EKG showed incomplete right bundle branch block which is unchanged from previous.  CT angiogram of the head and neck was negative for any intracranial abnormalities.  There was no occlusion of the circle of Willis.  There was minimal atherosclerotic vascular disease in the bilateral carotids.  Chest x-ray was negative.    Cardiology  was consulted to assist with management.  Echocardiogram was performed and did not show any wall motion abnormalities with preserved EF.  As result, cardiology felt that the patient could be discharged with follow-up for outpatient stress test.  MRI of the brain was negative for acute findings.    Discharge Diagnoses:  Visual disturbance/dizziness -MRI brain--negative for acute findings -CT angiogram head and neck--negative for hemorrhage;  Neg for hemodynamically significant stenosis -Echocardiogram--EF 55-60%, no WMA -no recurrence during hospitalization  Atypical chest pain -Mostly atypical by history, although was relieved by nitroglycerin -Consult cardiology--> stable for outpatient stress test as the patient has negative troponins and unremarkable echocardiogram -Echocardiogram--EF 55-60%, no WMA -Cycle troponins--neg x 3 -no recurrence during the hospitalization  Hyperlipidemia -Continue statin  Elevated blood pressure without hypertension -Monitor blood pressure during hospital stay  Anxiety -continue home dose alprazolam -f/u with PCP  Alcohol dependence -drinks one Etoh drink daily -CIWA  -no signs of withdrawal     Discharge Instructions  Discharge Instructions    Diet - low sodium heart healthy   Complete by:  As directed    Increase activity slowly   Complete by:  As directed      Allergies as of 12/20/2017   No Known Allergies     Medication List    TAKE these medications   ALPRAZolam 1 MG tablet Commonly known as:  XANAX Take 0.5-1 mg by mouth 2 (two) times daily as needed for anxiety.   aspirin EC 81 MG tablet Take 1 tablet (81 mg total) by mouth daily.   atorvastatin 20 MG tablet Commonly known  as:  LIPITOR Take 20 mg by mouth daily.   pantoprazole 40 MG tablet Commonly known as:  PROTONIX Take 40 mg by mouth 2 (two) times daily.       No Known Allergies  Consultations:  cardiology   Procedures/Studies: Ct Angio Head W  Or Wo Contrast  Result Date: 12/19/2017 CLINICAL DATA:  Dizziness, confusion, posterior headache, and hypertension. EXAM: CT ANGIOGRAPHY HEAD AND NECK TECHNIQUE: Multidetector CT imaging of the head and neck was performed using the standard protocol during bolus administration of intravenous contrast. Multiplanar CT image reconstructions and MIPs were obtained to evaluate the vascular anatomy. Carotid stenosis measurements (when applicable) are obtained utilizing NASCET criteria, using the distal internal carotid diameter as the denominator. CONTRAST:  38mL ISOVUE-370 IOPAMIDOL (ISOVUE-370) INJECTION 76% COMPARISON:  Brain MRI 10/21/2015. FINDINGS: CT HEAD FINDINGS Brain: There is no evidence of acute infarct, intracranial hemorrhage, mass, midline shift, or extra-axial fluid collection. The ventricles and sulci are normal. Vascular: Minimal carotid siphon calcification. Skull: No fracture or focal osseous lesion. Sinuses: Visualized paranasal sinuses and mastoid air cells are clear. Orbits: Unremarkable. Review of the MIP images confirms the above findings CTA NECK FINDINGS Aortic arch: Standard 3 vessel aortic arch. Widely patent arch vessel origins. Right carotid system: Patent without evidence of stenosis or dissection. Mild calcified and soft plaque at the carotid bifurcation. Left carotid system: Patent without evidence of stenosis or dissection. Minimal soft plaque at the carotid bifurcation. Vertebral arteries: Patent without evidence of stenosis or dissection. Moderately dominant right vertebral artery. Nonstenotic calcified distal right V1 segment plaque. Skeleton: C5-C7 ACDF with solid osseous fusion at both levels. Other neck: No mass or enlarged lymph nodes. Upper chest: Clear lung apices. Review of the MIP images confirms the above findings CTA HEAD FINDINGS Anterior circulation: The internal carotid arteries are widely patent from skull base to carotid termini with minimal supraclinoid calcification  bilaterally. ACAs and MCAs are patent without evidence of proximal branch occlusion or significant stenosis. The anterior communicating artery is unremarkable. No aneurysm or vascular malformation. Posterior circulation: The intracranial vertebral arteries are widely patent to the basilar. PICAs are patent with the left arising from the distal V3 segment. SCAs are patent. The basilar artery is widely patent. There are right larger than left posterior communicating arteries. PCAs are patent without evidence of significant stenosis. No aneurysm or vascular malformation. Venous sinuses: Patent. Anatomic variants: None. Delayed phase: No abnormal enhancement. Review of the MIP images confirms the above findings IMPRESSION: 1. Patent circle of Willis without proximal branch occlusion, significant stenosis, or aneurysm. 2. Minimal cervical carotid artery atherosclerosis without stenosis. 3. No evidence of acute intracranial abnormality. Electronically Signed   By: Logan Bores M.D.   On: 12/19/2017 10:40   Dg Chest 2 View  Result Date: 12/19/2017 CLINICAL DATA:  Chest pain. EXAM: CHEST  2 VIEW COMPARISON:  Chest x-ray dated July 14, 2014. FINDINGS: The heart size and mediastinal contours are within normal limits. Both lungs are clear. The visualized skeletal structures are unremarkable. IMPRESSION: No active cardiopulmonary disease. Electronically Signed   By: Titus Dubin M.D.   On: 12/19/2017 11:13   Ct Angio Neck W And/or Wo Contrast  Result Date: 12/19/2017 CLINICAL DATA:  Dizziness, confusion, posterior headache, and hypertension. EXAM: CT ANGIOGRAPHY HEAD AND NECK TECHNIQUE: Multidetector CT imaging of the head and neck was performed using the standard protocol during bolus administration of intravenous contrast. Multiplanar CT image reconstructions and MIPs were obtained to evaluate the vascular anatomy. Carotid  stenosis measurements (when applicable) are obtained utilizing NASCET criteria, using the  distal internal carotid diameter as the denominator. CONTRAST:  82mL ISOVUE-370 IOPAMIDOL (ISOVUE-370) INJECTION 76% COMPARISON:  Brain MRI 10/21/2015. FINDINGS: CT HEAD FINDINGS Brain: There is no evidence of acute infarct, intracranial hemorrhage, mass, midline shift, or extra-axial fluid collection. The ventricles and sulci are normal. Vascular: Minimal carotid siphon calcification. Skull: No fracture or focal osseous lesion. Sinuses: Visualized paranasal sinuses and mastoid air cells are clear. Orbits: Unremarkable. Review of the MIP images confirms the above findings CTA NECK FINDINGS Aortic arch: Standard 3 vessel aortic arch. Widely patent arch vessel origins. Right carotid system: Patent without evidence of stenosis or dissection. Mild calcified and soft plaque at the carotid bifurcation. Left carotid system: Patent without evidence of stenosis or dissection. Minimal soft plaque at the carotid bifurcation. Vertebral arteries: Patent without evidence of stenosis or dissection. Moderately dominant right vertebral artery. Nonstenotic calcified distal right V1 segment plaque. Skeleton: C5-C7 ACDF with solid osseous fusion at both levels. Other neck: No mass or enlarged lymph nodes. Upper chest: Clear lung apices. Review of the MIP images confirms the above findings CTA HEAD FINDINGS Anterior circulation: The internal carotid arteries are widely patent from skull base to carotid termini with minimal supraclinoid calcification bilaterally. ACAs and MCAs are patent without evidence of proximal branch occlusion or significant stenosis. The anterior communicating artery is unremarkable. No aneurysm or vascular malformation. Posterior circulation: The intracranial vertebral arteries are widely patent to the basilar. PICAs are patent with the left arising from the distal V3 segment. SCAs are patent. The basilar artery is widely patent. There are right larger than left posterior communicating arteries. PCAs are patent  without evidence of significant stenosis. No aneurysm or vascular malformation. Venous sinuses: Patent. Anatomic variants: None. Delayed phase: No abnormal enhancement. Review of the MIP images confirms the above findings IMPRESSION: 1. Patent circle of Willis without proximal branch occlusion, significant stenosis, or aneurysm. 2. Minimal cervical carotid artery atherosclerosis without stenosis. 3. No evidence of acute intracranial abnormality. Electronically Signed   By: Logan Bores M.D.   On: 12/19/2017 10:40   Mr Brain Wo Contrast  Result Date: 12/19/2017 CLINICAL DATA:  57 year old male with headache and blurred vision. EXAM: MRI HEAD WITHOUT CONTRAST TECHNIQUE: Multiplanar, multiecho pulse sequences of the brain and surrounding structures were obtained without intravenous contrast. COMPARISON:  CT head plus CTA head and neck 0955 hr today. [facility] brain MRI 10/21/2015. FINDINGS: Brain: Stable cerebral volume since 2016, within normal limits. No restricted diffusion to suggest acute infarction. No midline shift, mass effect, evidence of mass lesion, ventriculomegaly, extra-axial collection or acute intracranial hemorrhage. Cervicomedullary junction and pituitary are within normal limits. Scattered small nonspecific foci of cerebral white matter T2 and FLAIR hyperintensity appear largely stable since 2016. The extent is mild to moderate for age. The periventricular white matter and corpus callosum are spared. No cortical encephalomalacia or chronic cerebral blood products. The deep gray matter nuclei, brainstem, and cerebellum are normal. Vascular: Major intracranial vascular flow voids are stable since 2016. The distal right vertebral artery is mildly dominant. Skull and upper cervical spine: Negative visible cervical spine. Normal bone marrow signal. Sinuses/Orbits: Normal orbits soft tissues. Visualized paranasal sinuses and mastoids are stable and well pneumatized. Other: Visible  internal auditory structures appear normal. Scalp and face soft tissues appear negative. IMPRESSION: 1. No acute intracranial abnormality and stable noncontrast MRI appearance of the brain since 2016. 2. Chronic mild to moderate for age nonspecific  cerebral white matter signal changes. Electronically Signed   By: Genevie Ann M.D.   On: 12/19/2017 17:46   US Venous Img Upper Uni Left  Result Date: 12/19/2017 CLINICAL DATA:  Left upper extremity edema. History of rotator cuff surgery in May of 2018. Evaluate for DVT. EXAM: LEFT UPPER EXTREMITY VENOUS DOPPLER ULTRASOUND TECHNIQUE: Gray-scale sonography with graded compression, as well as color Doppler and duplex ultrasound were performed to evaluate the upper extremity deep venous system from the level of the subclavian vein and including the jugular, axillary, basilic, radial, ulnar and upper cephalic vein. Spectral Doppler was utilized to evaluate flow at rest and with distal augmentation maneuvers. COMPARISON:  None. FINDINGS: Contralateral Subclavian Vein: Respiratory phasicity is normal and symmetric with the symptomatic side. No evidence of thrombus. Normal compressibility. Internal Jugular Vein: No evidence of thrombus. Normal compressibility, respiratory phasicity and response to augmentation. Subclavian Vein: No evidence of thrombus. Normal compressibility, respiratory phasicity and response to augmentation. Axillary Vein: No evidence of thrombus. Normal compressibility, respiratory phasicity and response to augmentation. Cephalic Vein: No evidence of thrombus. Normal compressibility, respiratory phasicity and response to augmentation. Basilic Vein: No evidence of thrombus. Normal compressibility, respiratory phasicity and response to augmentation. Brachial Veins: No evidence of thrombus. Normal compressibility, respiratory phasicity and response to augmentation. Radial Veins: No evidence of thrombus. Normal compressibility, respiratory phasicity and response  to augmentation. Ulnar Veins: No evidence of thrombus. Normal compressibility, respiratory phasicity and response to augmentation. Venous Reflux:  None visualized. Other Findings:  None visualized. IMPRESSION: No evidence of DVT within the left upper extremity. Electronically Signed   By: Sandi Mariscal M.D.   On: 12/19/2017 11:14         Discharge Exam: Vitals:   12/19/17 2209 12/20/17 0500  BP: 105/65 100/64  Pulse: 66 63  Resp: 17 16  Temp: 97.6 F (36.4 C) (!) 97.5 F (36.4 C)  SpO2: 98% 100%   Vitals:   12/19/17 1500 12/19/17 1526 12/19/17 2209 12/20/17 0500  BP: 117/90 124/86 105/65 100/64  Pulse: (!) 51 64 66 63  Resp: 14 16 17 16   Temp:  97.6 F (36.4 C) 97.6 F (36.4 C) (!) 97.5 F (36.4 C)  TempSrc:  Oral Oral Oral  SpO2: 98% 100% 98% 100%  Weight:  94.3 kg (207 lb 12.8 oz)    Height:  6' (1.829 m)      General: Pt is alert, awake, not in acute distress Cardiovascular: RRR, S1/S2 +, no rubs, no gallops Respiratory: CTA bilaterally, no wheezing, no rhonchi Abdominal: Soft, NT, ND, bowel sounds + Extremities: no edema, no cyanosis   The results of significant diagnostics from this hospitalization (including imaging, microbiology, ancillary and laboratory) are listed below for reference.    Significant Diagnostic Studies: Ct Angio Head W Or Wo Contrast  Result Date: 12/19/2017 CLINICAL DATA:  Dizziness, confusion, posterior headache, and hypertension. EXAM: CT ANGIOGRAPHY HEAD AND NECK TECHNIQUE: Multidetector CT imaging of the head and neck was performed using the standard protocol during bolus administration of intravenous contrast. Multiplanar CT image reconstructions and MIPs were obtained to evaluate the vascular anatomy. Carotid stenosis measurements (when applicable) are obtained utilizing NASCET criteria, using the distal internal carotid diameter as the denominator. CONTRAST:  12mL ISOVUE-370 IOPAMIDOL (ISOVUE-370) INJECTION 76% COMPARISON:  Brain MRI  10/21/2015. FINDINGS: CT HEAD FINDINGS Brain: There is no evidence of acute infarct, intracranial hemorrhage, mass, midline shift, or extra-axial fluid collection. The ventricles and sulci are normal. Vascular: Minimal carotid siphon calcification. Skull:  No fracture or focal osseous lesion. Sinuses: Visualized paranasal sinuses and mastoid air cells are clear. Orbits: Unremarkable. Review of the MIP images confirms the above findings CTA NECK FINDINGS Aortic arch: Standard 3 vessel aortic arch. Widely patent arch vessel origins. Right carotid system: Patent without evidence of stenosis or dissection. Mild calcified and soft plaque at the carotid bifurcation. Left carotid system: Patent without evidence of stenosis or dissection. Minimal soft plaque at the carotid bifurcation. Vertebral arteries: Patent without evidence of stenosis or dissection. Moderately dominant right vertebral artery. Nonstenotic calcified distal right V1 segment plaque. Skeleton: C5-C7 ACDF with solid osseous fusion at both levels. Other neck: No mass or enlarged lymph nodes. Upper chest: Clear lung apices. Review of the MIP images confirms the above findings CTA HEAD FINDINGS Anterior circulation: The internal carotid arteries are widely patent from skull base to carotid termini with minimal supraclinoid calcification bilaterally. ACAs and MCAs are patent without evidence of proximal branch occlusion or significant stenosis. The anterior communicating artery is unremarkable. No aneurysm or vascular malformation. Posterior circulation: The intracranial vertebral arteries are widely patent to the basilar. PICAs are patent with the left arising from the distal V3 segment. SCAs are patent. The basilar artery is widely patent. There are right larger than left posterior communicating arteries. PCAs are patent without evidence of significant stenosis. No aneurysm or vascular malformation. Venous sinuses: Patent. Anatomic variants: None. Delayed  phase: No abnormal enhancement. Review of the MIP images confirms the above findings IMPRESSION: 1. Patent circle of Willis without proximal branch occlusion, significant stenosis, or aneurysm. 2. Minimal cervical carotid artery atherosclerosis without stenosis. 3. No evidence of acute intracranial abnormality. Electronically Signed   By: Logan Bores M.D.   On: 12/19/2017 10:40   Dg Chest 2 View  Result Date: 12/19/2017 CLINICAL DATA:  Chest pain. EXAM: CHEST  2 VIEW COMPARISON:  Chest x-ray dated July 14, 2014. FINDINGS: The heart size and mediastinal contours are within normal limits. Both lungs are clear. The visualized skeletal structures are unremarkable. IMPRESSION: No active cardiopulmonary disease. Electronically Signed   By: Titus Dubin M.D.   On: 12/19/2017 11:13   Ct Angio Neck W And/or Wo Contrast  Result Date: 12/19/2017 CLINICAL DATA:  Dizziness, confusion, posterior headache, and hypertension. EXAM: CT ANGIOGRAPHY HEAD AND NECK TECHNIQUE: Multidetector CT imaging of the head and neck was performed using the standard protocol during bolus administration of intravenous contrast. Multiplanar CT image reconstructions and MIPs were obtained to evaluate the vascular anatomy. Carotid stenosis measurements (when applicable) are obtained utilizing NASCET criteria, using the distal internal carotid diameter as the denominator. CONTRAST:  62mL ISOVUE-370 IOPAMIDOL (ISOVUE-370) INJECTION 76% COMPARISON:  Brain MRI 10/21/2015. FINDINGS: CT HEAD FINDINGS Brain: There is no evidence of acute infarct, intracranial hemorrhage, mass, midline shift, or extra-axial fluid collection. The ventricles and sulci are normal. Vascular: Minimal carotid siphon calcification. Skull: No fracture or focal osseous lesion. Sinuses: Visualized paranasal sinuses and mastoid air cells are clear. Orbits: Unremarkable. Review of the MIP images confirms the above findings CTA NECK FINDINGS Aortic arch: Standard 3 vessel  aortic arch. Widely patent arch vessel origins. Right carotid system: Patent without evidence of stenosis or dissection. Mild calcified and soft plaque at the carotid bifurcation. Left carotid system: Patent without evidence of stenosis or dissection. Minimal soft plaque at the carotid bifurcation. Vertebral arteries: Patent without evidence of stenosis or dissection. Moderately dominant right vertebral artery. Nonstenotic calcified distal right V1 segment plaque. Skeleton: C5-C7 ACDF with solid osseous fusion  at both levels. Other neck: No mass or enlarged lymph nodes. Upper chest: Clear lung apices. Review of the MIP images confirms the above findings CTA HEAD FINDINGS Anterior circulation: The internal carotid arteries are widely patent from skull base to carotid termini with minimal supraclinoid calcification bilaterally. ACAs and MCAs are patent without evidence of proximal branch occlusion or significant stenosis. The anterior communicating artery is unremarkable. No aneurysm or vascular malformation. Posterior circulation: The intracranial vertebral arteries are widely patent to the basilar. PICAs are patent with the left arising from the distal V3 segment. SCAs are patent. The basilar artery is widely patent. There are right larger than left posterior communicating arteries. PCAs are patent without evidence of significant stenosis. No aneurysm or vascular malformation. Venous sinuses: Patent. Anatomic variants: None. Delayed phase: No abnormal enhancement. Review of the MIP images confirms the above findings IMPRESSION: 1. Patent circle of Willis without proximal branch occlusion, significant stenosis, or aneurysm. 2. Minimal cervical carotid artery atherosclerosis without stenosis. 3. No evidence of acute intracranial abnormality. Electronically Signed   By: Logan Bores M.D.   On: 12/19/2017 10:40   Mr Brain Wo Contrast  Result Date: 12/19/2017 CLINICAL DATA:  57 year old male with headache and blurred  vision. EXAM: MRI HEAD WITHOUT CONTRAST TECHNIQUE: Multiplanar, multiecho pulse sequences of the brain and surrounding structures were obtained without intravenous contrast. COMPARISON:  CT head plus CTA head and neck 0955 hr today. [facility] brain MRI 10/21/2015. FINDINGS: Brain: Stable cerebral volume since 2016, within normal limits. No restricted diffusion to suggest acute infarction. No midline shift, mass effect, evidence of mass lesion, ventriculomegaly, extra-axial collection or acute intracranial hemorrhage. Cervicomedullary junction and pituitary are within normal limits. Scattered small nonspecific foci of cerebral white matter T2 and FLAIR hyperintensity appear largely stable since 2016. The extent is mild to moderate for age. The periventricular white matter and corpus callosum are spared. No cortical encephalomalacia or chronic cerebral blood products. The deep gray matter nuclei, brainstem, and cerebellum are normal. Vascular: Major intracranial vascular flow voids are stable since 2016. The distal right vertebral artery is mildly dominant. Skull and upper cervical spine: Negative visible cervical spine. Normal bone marrow signal. Sinuses/Orbits: Normal orbits soft tissues. Visualized paranasal sinuses and mastoids are stable and well pneumatized. Other: Visible internal auditory structures appear normal. Scalp and face soft tissues appear negative. IMPRESSION: 1. No acute intracranial abnormality and stable noncontrast MRI appearance of the brain since 2016. 2. Chronic mild to moderate for age nonspecific cerebral white matter signal changes. Electronically Signed   By: Genevie Ann M.D.   On: 12/19/2017 17:46   US Venous Img Upper Uni Left  Result Date: 12/19/2017 CLINICAL DATA:  Left upper extremity edema. History of rotator cuff surgery in May of 2018. Evaluate for DVT. EXAM: LEFT UPPER EXTREMITY VENOUS DOPPLER ULTRASOUND TECHNIQUE: Gray-scale sonography with graded compression, as  well as color Doppler and duplex ultrasound were performed to evaluate the upper extremity deep venous system from the level of the subclavian vein and including the jugular, axillary, basilic, radial, ulnar and upper cephalic vein. Spectral Doppler was utilized to evaluate flow at rest and with distal augmentation maneuvers. COMPARISON:  None. FINDINGS: Contralateral Subclavian Vein: Respiratory phasicity is normal and symmetric with the symptomatic side. No evidence of thrombus. Normal compressibility. Internal Jugular Vein: No evidence of thrombus. Normal compressibility, respiratory phasicity and response to augmentation. Subclavian Vein: No evidence of thrombus. Normal compressibility, respiratory phasicity and response to augmentation. Axillary Vein: No evidence of  thrombus. Normal compressibility, respiratory phasicity and response to augmentation. Cephalic Vein: No evidence of thrombus. Normal compressibility, respiratory phasicity and response to augmentation. Basilic Vein: No evidence of thrombus. Normal compressibility, respiratory phasicity and response to augmentation. Brachial Veins: No evidence of thrombus. Normal compressibility, respiratory phasicity and response to augmentation. Radial Veins: No evidence of thrombus. Normal compressibility, respiratory phasicity and response to augmentation. Ulnar Veins: No evidence of thrombus. Normal compressibility, respiratory phasicity and response to augmentation. Venous Reflux:  None visualized. Other Findings:  None visualized. IMPRESSION: No evidence of DVT within the left upper extremity. Electronically Signed   By: Sandi Mariscal M.D.   On: 12/19/2017 11:14     Microbiology: No results found for this or any previous visit (from the past 240 hour(s)).   Labs: Basic Metabolic Panel: Recent Labs  Lab 12/19/17 0928 12/19/17 0952 12/20/17 0300  NA 139 141 140  K 3.7 3.8 3.3*  CL 105 104 107  CO2 25  --  24  GLUCOSE 109* 104* 110*  BUN 13 12 13    CREATININE 0.95 0.90 1.06  CALCIUM 9.2  --  8.8*   Liver Function Tests: No results for input(s): AST, ALT, ALKPHOS, BILITOT, PROT, ALBUMIN in the last 168 hours. No results for input(s): LIPASE, AMYLASE in the last 168 hours. No results for input(s): AMMONIA in the last 168 hours. CBC: Recent Labs  Lab 12/19/17 0928 12/19/17 0952  WBC 5.7  --   NEUTROABS 3.6  --   HGB 14.7 15.6  HCT 44.1 46.0  MCV 89.3  --   PLT 183  --    Cardiac Enzymes: Recent Labs  Lab 12/19/17 1542 12/19/17 2047 12/20/17 0300  TROPONINI <0.03 <0.03 <0.03   BNP: Invalid input(s): POCBNP CBG: No results for input(s): GLUCAP in the last 168 hours.  Time coordinating discharge:  Greater than 30 minutes  Signed:  Orson Eva, DO [facility] Pager: [phone] 12/20/2017, 1:27 PM
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
## 2018-01-03 ENCOUNTER — Encounter: Payer: Self-pay | Admitting: Physician Assistant

## 2018-01-03 ENCOUNTER — Telehealth: Payer: Self-pay | Admitting: *Deleted

## 2018-01-03 ENCOUNTER — Ambulatory Visit: Payer: BLUE CROSS/BLUE SHIELD | Admitting: Physician Assistant

## 2018-01-03 ENCOUNTER — Other Ambulatory Visit (HOSPITAL_COMMUNITY)
Admission: RE | Admit: 2018-01-03 | Discharge: 2018-01-03 | Disposition: A | Discharge status: Home / Self Care | Payer: BLUE CROSS/BLUE SHIELD | Source: Ambulatory Visit | Attending: Physician Assistant | Admitting: Physician Assistant

## 2018-01-03 VITALS — BP 114/74 | HR 79 | Ht 73.0 in | Wt 227.0 lb

## 2018-01-03 DIAGNOSIS — E876 Hypokalemia: Secondary | ICD-10-CM

## 2018-01-03 DIAGNOSIS — E785 Hyperlipidemia, unspecified: Secondary | ICD-10-CM

## 2018-01-03 DIAGNOSIS — R0789 Other chest pain: Secondary | ICD-10-CM

## 2018-01-03 DIAGNOSIS — R030 Elevated blood-pressure reading, without diagnosis of hypertension: Secondary | ICD-10-CM | POA: Diagnosis not present

## 2018-01-03 LAB — BASIC METABOLIC PANEL
Anion gap: 9 (ref 5–15)
BUN: 16 mg/dL (ref 6–20)
CALCIUM: 8.9 mg/dL (ref 8.9–10.3)
CO2: 24 mmol/L (ref 22–32)
Chloride: 106 mmol/L (ref 101–111)
Creatinine, Ser: 1.01 mg/dL (ref 0.61–1.24)
GFR calc Af Amer: >60 mL/min (ref >60)
GLUCOSE: 96 mg/dL (ref 65–99)
Potassium: 3.9 mmol/L (ref 3.5–5.1)
Sodium: 139 mmol/L (ref 135–145)

## 2018-01-03 LAB — MAGNESIUM: Magnesium: 2.3 mg/dL (ref 1.7–2.4)

## 2018-01-03 NOTE — Telephone Encounter (Signed)
Called patient with test results. No answer. Left message to call back.  

## 2018-01-03 NOTE — Patient Instructions (Signed)
Medication Instructions:  Your physician recommends that you continue on your current medications as directed. Please refer to the Current Medication list given to you today.   Labwork: Your physician recommends that you return for lab work today.   Testing/Procedures: Your physician has requested that you have an exercise tolerance test. For further information please visit [email or web address]. Please also follow instruction sheet, as given.    Follow-Up: Your physician wants you to follow-up in: 6 Months.  You will receive a reminder letter in the mail two months in advance. If you don't receive a letter, please call our office to schedule the follow-up appointment.   Any Other Special Instructions Will Be Listed Below (If Applicable).     If you need a refill on your cardiac medications before your next appointment, please call your pharmacy. Thank you for choosing [facility]!

## 2018-01-03 NOTE — Telephone Encounter (Signed)
-----   Message from Charlie Pitter, [place] sent at 01/03/2018  4:32 PM EST ----- Please let patient know labs were normal. Low potassium has resolved. Magnesium level (which can contribute to low potassium) is also normal. Melina Copa PA-C

## 2018-01-11 ENCOUNTER — Ambulatory Visit (HOSPITAL_COMMUNITY)
Admission: RE | Admit: 2018-01-11 | Discharge: 2018-01-11 | Disposition: A | Discharge status: Home / Self Care | Payer: BLUE CROSS/BLUE SHIELD | Source: Ambulatory Visit | Attending: Physician Assistant | Admitting: Physician Assistant

## 2018-01-11 DIAGNOSIS — R0789 Other chest pain: Secondary | ICD-10-CM

## 2018-01-11 LAB — EXERCISE TOLERANCE TEST
CHL CUP MPHR: 162 {beats}/min
CHL RATE OF PERCEIVED EXERTION: 15
CSEPED: 7 min
CSEPHR: 88 %
Estimated workload: 10.4 METS
Exercise duration (sec): 41 s
Peak HR: 144 {beats}/min
Rest HR: 60 {beats}/min

## 2018-03-29 ENCOUNTER — Encounter: Payer: Self-pay | Admitting: Emergency Medicine

## 2018-07-07 IMAGING — CR DG CHEST 2V
2 series · 2 of 2 positions shown · non-contrast
Comparison: None.

CLINICAL DATA: Shortness of breath and dry cough.

EXAM:
CHEST - 2 VIEW

[chest pa]
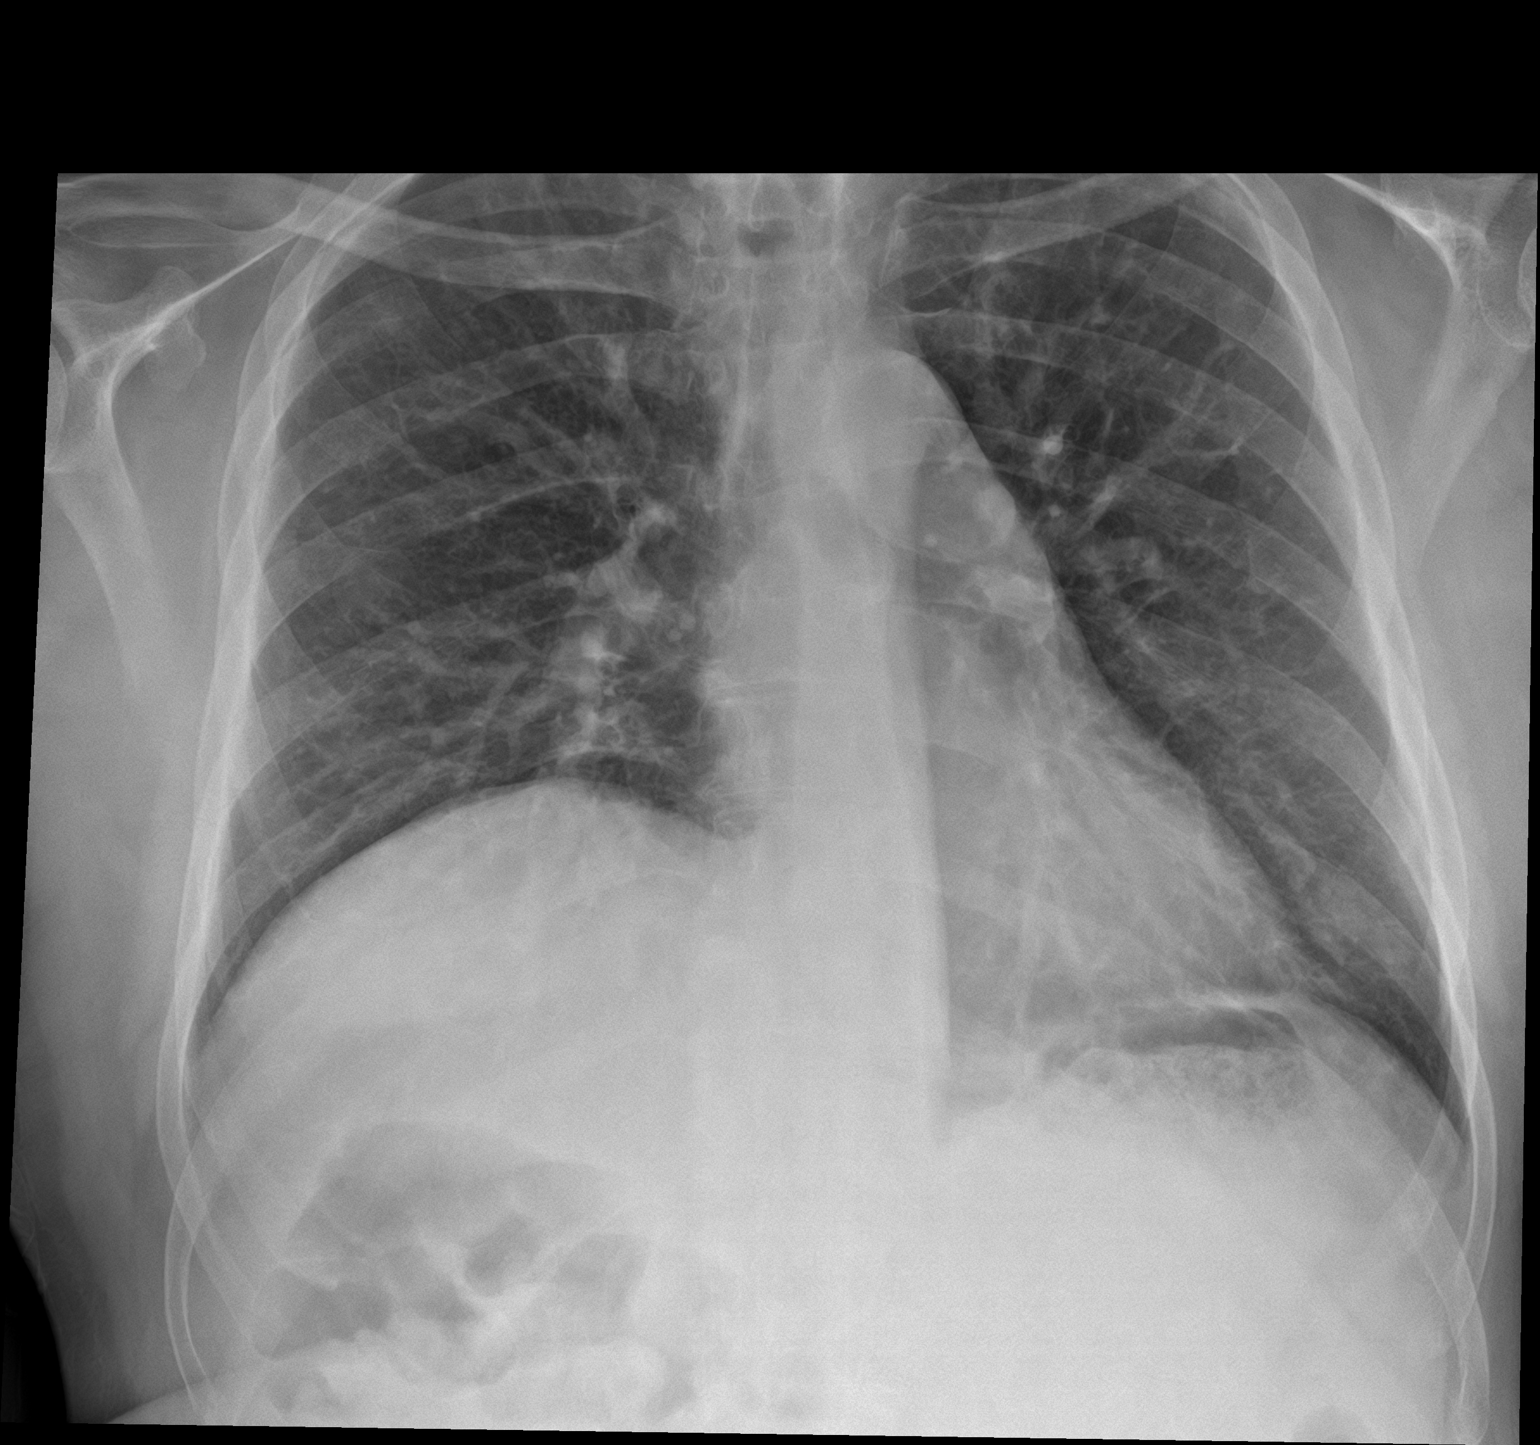

[chest lat]
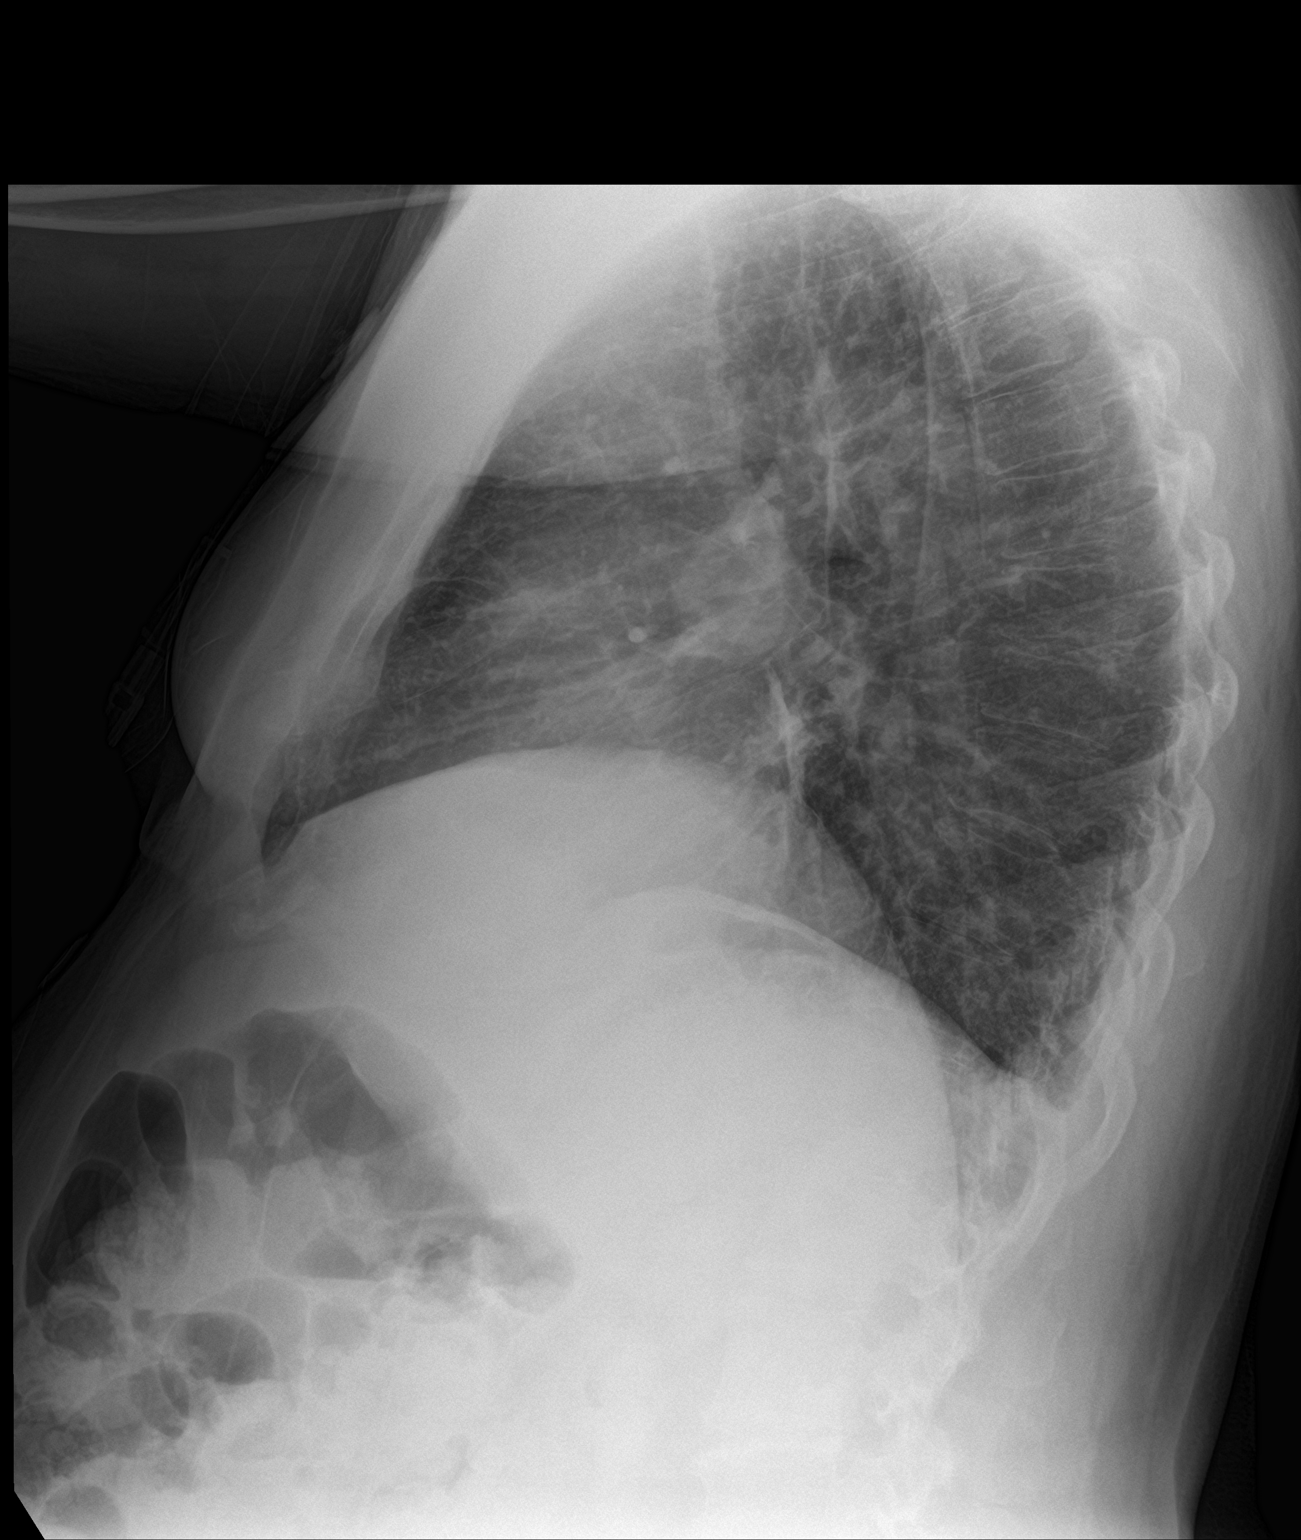

[2 of 2 positions shown; findings below may reference images not displayed]

FINDINGS: The heart size and mediastinal contours are within normal limits.
Normal pulmonary vascularity. No focal consolidation, pleural
effusion, or pneumothorax. No acute osseous abnormality.
IMPRESSION: No active cardiopulmonary disease.

## 2018-07-16 DIAGNOSIS — E785 Hyperlipidemia, unspecified: Secondary | ICD-10-CM | POA: Diagnosis not present

## 2018-07-16 DIAGNOSIS — R079 Chest pain, unspecified: Secondary | ICD-10-CM | POA: Diagnosis not present

## 2018-07-16 DIAGNOSIS — R1013 Epigastric pain: Secondary | ICD-10-CM | POA: Diagnosis not present

## 2018-07-16 DIAGNOSIS — K219 Gastro-esophageal reflux disease without esophagitis: Secondary | ICD-10-CM | POA: Diagnosis not present

## 2018-10-14 DIAGNOSIS — Z0001 Encounter for general adult medical examination with abnormal findings: Secondary | ICD-10-CM | POA: Diagnosis not present

## 2018-10-17 DIAGNOSIS — Z6829 Body mass index (BMI) 29.0-29.9, adult: Secondary | ICD-10-CM | POA: Diagnosis not present

## 2018-10-17 DIAGNOSIS — Z0001 Encounter for general adult medical examination with abnormal findings: Secondary | ICD-10-CM | POA: Diagnosis not present

## 2018-10-17 DIAGNOSIS — K219 Gastro-esophageal reflux disease without esophagitis: Secondary | ICD-10-CM | POA: Diagnosis not present

## 2018-12-25 DIAGNOSIS — Z800 Family history of malignant neoplasm of digestive organs: Secondary | ICD-10-CM | POA: Diagnosis not present

## 2019-01-17 DIAGNOSIS — Z800 Family history of malignant neoplasm of digestive organs: Secondary | ICD-10-CM | POA: Diagnosis not present

## 2019-01-17 DIAGNOSIS — D125 Benign neoplasm of sigmoid colon: Secondary | ICD-10-CM | POA: Diagnosis not present

## 2019-01-17 DIAGNOSIS — K644 Residual hemorrhoidal skin tags: Secondary | ICD-10-CM | POA: Diagnosis not present

## 2019-01-17 DIAGNOSIS — Z1211 Encounter for screening for malignant neoplasm of colon: Secondary | ICD-10-CM | POA: Diagnosis not present

## 2019-01-17 DIAGNOSIS — K640 First degree hemorrhoids: Secondary | ICD-10-CM | POA: Diagnosis not present

## 2019-07-07 ENCOUNTER — Other Ambulatory Visit: Payer: Self-pay

## 2019-07-07 DIAGNOSIS — Z20822 Contact with and (suspected) exposure to covid-19: Secondary | ICD-10-CM

## 2019-07-09 LAB — NOVEL CORONAVIRUS, NAA: SARS-CoV-2, NAA: NOT DETECTED

## 2019-09-05 ENCOUNTER — Other Ambulatory Visit: Payer: Self-pay

## 2019-09-05 DIAGNOSIS — Z20822 Contact with and (suspected) exposure to covid-19: Secondary | ICD-10-CM

## 2019-09-07 LAB — NOVEL CORONAVIRUS, NAA: SARS-CoV-2, NAA: NOT DETECTED

## 2020-05-05 ENCOUNTER — Encounter: Payer: Self-pay | Admitting: Orthopaedic Surgery

## 2020-05-05 ENCOUNTER — Other Ambulatory Visit: Payer: Self-pay

## 2020-05-05 ENCOUNTER — Ambulatory Visit (INDEPENDENT_AMBULATORY_CARE_PROVIDER_SITE_OTHER): Payer: Self-pay | Admitting: Orthopaedic Surgery

## 2020-05-05 VITALS — Ht 72.0 in | Wt 240.0 lb

## 2020-05-05 DIAGNOSIS — M25551 Pain in right hip: Secondary | ICD-10-CM | POA: Insufficient documentation

## 2020-05-05 NOTE — Progress Notes (Signed)
Office Visit Note   Patient: Steve Stone           Date of Birth: [birth year]           MRN: [id number] Visit Date: 05/05/2020              Requested by: Curlene Labrum, MD [place],  [place] [address] PCP: Curlene Labrum, MD   Assessment & Plan: Visit Diagnoses:  1. Pain in right hip     Plan: Mr. Kissoon has evidence of a superior labral tear of the right hip based on recent MRI performed at [facility] and ordered by his family physician.  He does have considerable groin pain.  We have discussed different treatment options including cortisone injection but he preferred to have a more definitive treatment.  I will refer to Dr. Aretha Parrot at [facility] for consideration of hip arthroscopy  Follow-Up Instructions: Return Will refer to Dr. Aretha Parrot at [facility].   Orders:  Orders Placed This Encounter  Procedures  . Ambulatory referral to Orthopedic Surgery   No orders of the defined types were placed in this encounter.     Procedures: No procedures performed   Clinical Data: No additional findings.   Subjective: Chief Complaint  Patient presents with  . Right Hip - Pain  Patient presents with right hip pain x 2-3 months. He denies any known injury. The pain is a "nagging ache" that he has all of the time. It hurts to drive and is painful to sleep. He walks/stands at work 8-20 hr/day x 6 days a weeks. He has taken two rounds of prednisone. He had relief with the first round but the pain came back. He did not notice much difference after the second round. He has had a MRI done at [facility].  MRI scan was performed 2 weeks ago at [facility].  The impression revealed no acute osseous abnormalities within the pelvis.  There was a partial-thickness tear of the superior labrum right hip.  Mild tendinosis with low-grade insertional tear of the right gluteus minimus tendon and mild tendinosis of the bilateral hamstring tendons with a low-grade insertional  tear on the right.  Included intrapelvic contents were within normal limits.  HPI  Review of Systems   Objective: Vital Signs: Ht 6' (1.829 m)   Wt 240 lb (108.9 kg)   BMI 32.55 kg/m   Physical Exam Constitutional:      Appearance: He is well-developed.  Eyes:     Pupils: Pupils are equal, round, and reactive to light.  Pulmonary:     Effort: Pulmonary effort is normal.  Skin:    General: Skin is warm and dry.  Neurological:     Mental Status: He is alert and oriented to person, place, and time.  Psychiatric:        Behavior: Behavior normal.     Ortho Exam positive pain right hip with external rotation and minimally with internal rotation.  Very minimal loss of motion compared to the left asymptomatic hip.  Pain increases with hyperflexion of the right hip.  Neurologically intact.  No pain along the lateral aspect of the hip.  No masses identified.   Specialty Comments:  No specialty comments available.  Imaging: No results found.   PMFS History: Patient Active Problem List   Diagnosis Date Noted  . Pain in right hip 05/05/2020  . Visual disturbance 12/19/2017  . Blurry vision, bilateral   . Occipital headache   .  Elevated BP without diagnosis of hypertension   . Anxiety   . Mixed hyperlipidemia 07/15/2014  . Emotional stress 07/15/2014  . Precordial pain 07/14/2014  . Chest pain 09/26/2012   Past Medical History:  Diagnosis Date  . Esophageal reflux   . Hyperlipidemia     Family History  Problem Relation Age of Onset  . Heart disease Unknown        Grandparents when they were in the 36s  . Colon cancer Father        Father    Past Surgical History:  Procedure Laterality Date  . APPENDECTOMY    . Arthroscopic knee surgery Right   . CHOLECYSTECTOMY    . HERNIA REPAIR     Social History   Occupational History    Employer: [employer]  Tobacco Use  . Smoking status: Never Smoker  . Smokeless tobacco: Current User    Types: Chew  .  Tobacco comment: The patient does use smokeless tobacco and dips  Vaping Use  . Vaping Use: Never used  Substance and Sexual Activity  . Alcohol use: No    Comment: One beer per night  . Drug use: No  . Sexual activity: Not on file
